# Patient Record
Sex: Female | Born: 1955 | Race: Black or African American | Hispanic: No | Smoking: Former smoker
Health system: Southern US, Community
[De-identification: ages and names within clinical notes are randomized; demographics above are authoritative.]

## PROBLEM LIST (undated history)

## (undated) DIAGNOSIS — K219 Gastro-esophageal reflux disease without esophagitis: Secondary | ICD-10-CM

## (undated) DIAGNOSIS — M810 Age-related osteoporosis without current pathological fracture: Secondary | ICD-10-CM

## (undated) DIAGNOSIS — R000 Tachycardia, unspecified: Secondary | ICD-10-CM

## (undated) DIAGNOSIS — N959 Unspecified menopausal and perimenopausal disorder: Secondary | ICD-10-CM

## (undated) DIAGNOSIS — N200 Calculus of kidney: Secondary | ICD-10-CM

## (undated) DIAGNOSIS — F32A Depression, unspecified: Secondary | ICD-10-CM

## (undated) DIAGNOSIS — F329 Major depressive disorder, single episode, unspecified: Secondary | ICD-10-CM

## (undated) DIAGNOSIS — B20 Human immunodeficiency virus [HIV] disease: Secondary | ICD-10-CM

## (undated) HISTORY — DX: Tachycardia, unspecified: R00.0

## (undated) HISTORY — DX: Gastro-esophageal reflux disease without esophagitis: K21.9

## (undated) HISTORY — PX: OTHER SURGICAL HISTORY: SHX169

## (undated) HISTORY — DX: Unspecified menopausal and perimenopausal disorder: N95.9

## (undated) HISTORY — DX: Human immunodeficiency virus (HIV) disease: B20

## (undated) HISTORY — DX: Depression, unspecified: F32.A

## (undated) HISTORY — DX: Major depressive disorder, single episode, unspecified: F32.9

## (undated) HISTORY — DX: Age-related osteoporosis without current pathological fracture: M81.0

---

## 1987-05-17 DIAGNOSIS — B20 Human immunodeficiency virus [HIV] disease: Secondary | ICD-10-CM

## 1987-05-17 HISTORY — DX: Human immunodeficiency virus (HIV) disease: B20

## 2009-04-08 ENCOUNTER — Emergency Department (HOSPITAL_COMMUNITY): Admission: EM | Admit: 2009-04-08 | Discharge: 2009-04-08 | Payer: Self-pay | Admitting: Emergency Medicine

## 2010-08-18 LAB — URINE MICROSCOPIC-ADD ON

## 2010-08-18 LAB — URINALYSIS, ROUTINE W REFLEX MICROSCOPIC
Bilirubin Urine: NEGATIVE
Glucose, UA: NEGATIVE mg/dL
Specific Gravity, Urine: 1.013 (ref 1.005–1.030)
Urobilinogen, UA: 1 mg/dL (ref 0.0–1.0)

## 2010-08-18 LAB — RAPID STREP SCREEN (MED CTR MEBANE ONLY): Streptococcus, Group A Screen (Direct): NEGATIVE

## 2017-02-11 ENCOUNTER — Emergency Department (HOSPITAL_COMMUNITY): Payer: Medicaid - Out of State

## 2017-02-11 ENCOUNTER — Emergency Department (HOSPITAL_COMMUNITY)
Admission: EM | Admit: 2017-02-11 | Discharge: 2017-02-11 | Disposition: A | Discharge status: Home / Self Care | Payer: Medicaid - Out of State | Attending: Emergency Medicine | Admitting: Emergency Medicine

## 2017-02-11 ENCOUNTER — Encounter (HOSPITAL_COMMUNITY): Payer: Self-pay | Admitting: Emergency Medicine

## 2017-02-11 DIAGNOSIS — N23 Unspecified renal colic: Secondary | ICD-10-CM | POA: Diagnosis not present

## 2017-02-11 DIAGNOSIS — Z79899 Other long term (current) drug therapy: Secondary | ICD-10-CM | POA: Insufficient documentation

## 2017-02-11 DIAGNOSIS — R1031 Right lower quadrant pain: Secondary | ICD-10-CM | POA: Diagnosis present

## 2017-02-11 HISTORY — DX: Calculus of kidney: N20.0

## 2017-02-11 LAB — URINALYSIS, ROUTINE W REFLEX MICROSCOPIC
BILIRUBIN URINE: NEGATIVE
Glucose, UA: NEGATIVE mg/dL
Ketones, ur: NEGATIVE mg/dL
LEUKOCYTES UA: NEGATIVE
NITRITE: NEGATIVE
PROTEIN: 100 mg/dL — AB
Specific Gravity, Urine: 1.02 (ref 1.005–1.030)
pH: 6 (ref 5.0–8.0)

## 2017-02-11 LAB — CBC
HCT: 34.4 % — ABNORMAL LOW (ref 36.0–46.0)
Hemoglobin: 12.2 g/dL (ref 12.0–15.0)
MCH: 31.9 pg (ref 26.0–34.0)
MCHC: 35.5 g/dL (ref 30.0–36.0)
MCV: 89.8 fL (ref 78.0–100.0)
PLATELETS: 223 10*3/uL (ref 150–400)
RBC: 3.83 MIL/uL — ABNORMAL LOW (ref 3.87–5.11)
RDW: 13.8 % (ref 11.5–15.5)
WBC: 6.9 10*3/uL (ref 4.0–10.5)

## 2017-02-11 LAB — COMPREHENSIVE METABOLIC PANEL
ALT: 18 U/L (ref 14–54)
AST: 30 U/L (ref 15–41)
Albumin: 3.9 g/dL (ref 3.5–5.0)
Alkaline Phosphatase: 48 U/L (ref 38–126)
Anion gap: 8 (ref 5–15)
BILIRUBIN TOTAL: 0.8 mg/dL (ref 0.3–1.2)
BUN: 15 mg/dL (ref 6–20)
CHLORIDE: 105 mmol/L (ref 101–111)
CO2: 27 mmol/L (ref 22–32)
CREATININE: 1 mg/dL (ref 0.44–1.00)
Calcium: 8.5 mg/dL — ABNORMAL LOW (ref 8.9–10.3)
GFR calc Af Amer: >60 mL/min (ref >60)
GFR, EST NON AFRICAN AMERICAN: 60 mL/min — AB (ref >60)
Glucose, Bld: 112 mg/dL — ABNORMAL HIGH (ref 65–99)
POTASSIUM: 3 mmol/L — AB (ref 3.5–5.1)
Sodium: 140 mmol/L (ref 135–145)
TOTAL PROTEIN: 7.4 g/dL (ref 6.5–8.1)

## 2017-02-11 LAB — LIPASE, BLOOD: LIPASE: 18 U/L (ref 11–51)

## 2017-02-11 MED ORDER — OXYCODONE-ACETAMINOPHEN 5-325 MG PO TABS
2.0000 | ORAL_TABLET | Freq: Once | ORAL | Status: DC
  Filled 2017-02-11: qty 2

## 2017-02-11 MED ORDER — PROMETHAZINE HCL 25 MG PO TABS
25.0000 mg | ORAL_TABLET | Freq: Once | ORAL | Status: AC
  Administered 2017-02-11: 25 mg via ORAL
  Filled 2017-02-11: qty 1

## 2017-02-11 MED ORDER — ONDANSETRON HCL 4 MG/2ML IJ SOLN
4.0000 mg | Freq: Once | INTRAMUSCULAR | Status: DC
Start: 2017-02-11 — End: 2017-02-11
  Filled 2017-02-11: qty 2

## 2017-02-11 MED ORDER — ONDANSETRON 4 MG PO TBDP
4.0000 mg | ORAL_TABLET | Freq: Once | ORAL | Status: AC
  Administered 2017-02-11: 4 mg via ORAL
  Filled 2017-02-11: qty 1

## 2017-02-11 MED ORDER — HYDROMORPHONE HCL 1 MG/ML IJ SOLN
1.0000 mg | Freq: Once | INTRAMUSCULAR | Status: AC
  Administered 2017-02-11: 1 mg via INTRAMUSCULAR
  Filled 2017-02-11: qty 1

## 2017-02-11 MED ORDER — ONDANSETRON 4 MG PO TBDP
4.0000 mg | ORAL_TABLET | Freq: Once | ORAL | Status: AC | PRN
  Administered 2017-02-11: 4 mg via ORAL
  Filled 2017-02-11: qty 1

## 2017-02-11 MED ORDER — HYDROCODONE-ACETAMINOPHEN 5-325 MG PO TABS: 1.0000 | ORAL_TABLET | Freq: Four times a day (QID) | ORAL | 0 refills | Status: DC | PRN

## 2017-02-11 MED ORDER — ONDANSETRON HCL 4 MG PO TABS: 4.0000 mg | ORAL_TABLET | Freq: Four times a day (QID) | ORAL | 0 refills | Status: DC | PRN

## 2017-02-11 MED ORDER — IBUPROFEN 600 MG PO TABS: 600.0000 mg | ORAL_TABLET | Freq: Four times a day (QID) | ORAL | 0 refills | Status: DC | PRN

## 2017-02-11 MED ORDER — MORPHINE SULFATE (PF) 4 MG/ML IV SOLN
4.0000 mg | Freq: Once | INTRAVENOUS | Status: AC
  Administered 2017-02-11: 4 mg via INTRAMUSCULAR
  Filled 2017-02-11: qty 1

## 2017-02-11 MED ORDER — KETOROLAC TROMETHAMINE 60 MG/2ML IM SOLN
60.0000 mg | Freq: Once | INTRAMUSCULAR | Status: AC
  Administered 2017-02-11: 60 mg via INTRAMUSCULAR
  Filled 2017-02-11: qty 2

## 2017-02-11 MED ORDER — HYDROMORPHONE HCL 1 MG/ML IJ SOLN
1.0000 mg | Freq: Once | INTRAMUSCULAR | Status: AC
  Administered 2017-02-11: 1 mg via INTRAVENOUS
  Filled 2017-02-11: qty 1

## 2017-02-11 NOTE — ED Triage Notes (Signed)
Pt states she has a hx of kidney stones and is having the same type symptoms as she does when she has them  Pt is c/o sharp pain in her right flank area and is having active nausea and vomiting in triage

## 2017-02-11 NOTE — ED Notes (Signed)
IV team successful with IV stick unsuccessful with lab draw. Phlebotomy has attempted also. Will contact main lab.

## 2017-02-11 NOTE — ED Notes (Signed)
She feels "better" and elects to be d'cd. At this time.

## 2017-02-11 NOTE — ED Provider Notes (Signed)
[facility]-EMERGENCY DEPT Provider Note   CSN: [id number] Arrival date & time: 02/11/17  0047     History   Chief Complaint Chief Complaint  Patient presents with  . Flank Pain    HPI Debra Freeman is a 61 y.o. female.  HPI Patient presents with right-sided flank pain that started yesterday morning. No radiation of the pain. Associated with nausea and vomiting. Patient says she's had chills but no fever. States she's had previous kidney stones in the past and this is similar. No diarrhea or constipation. Denies hematuria, frequency or urgency. No vaginal symptoms. Past Medical History:  Diagnosis Date  . Kidney stones     There are no active problems to display for this patient.   Past Surgical History:  Procedure Laterality Date  . c section x 4    . kidney stones removed      OB History    No data available       Home Medications    Prior to Admission medications   Medication Sig Start Date End Date Taking? Authorizing Provider  omeprazole (PRILOSEC) 40 MG capsule Take 40 mg by mouth daily.   Yes [provider]  QUEtiapine (SEROQUEL) 25 MG tablet Take 25 mg by mouth at bedtime.   Yes [provider]  HYDROcodone-acetaminophen (NORCO) 5-325 MG tablet Take 1-2 tablets by mouth every 6 (six) hours as needed for severe pain. 02/11/17   Loren Racer, MD  ibuprofen (ADVIL,MOTRIN) 600 MG tablet Take 1 tablet (600 mg total) by mouth every 6 (six) hours as needed for moderate pain. 02/11/17   Loren Racer, MD  ondansetron (ZOFRAN) 4 MG tablet Take 1 tablet (4 mg total) by mouth every 6 (six) hours as needed for nausea or vomiting. 02/11/17   Loren Racer, MD    Family History History reviewed. No pertinent family history.  Social History Social History  Substance Use Topics  . Smoking status: Never Smoker  . Smokeless tobacco: Never Used  . Alcohol use Yes     Comment: special occassions     Allergies   Mefoxin  [cefoxitin]   Review of Systems Review of Systems  Constitutional: Positive for chills. Negative for fever.  Respiratory: Negative for cough and shortness of breath.   Cardiovascular: Negative for chest pain.  Gastrointestinal: Positive for nausea and vomiting. Negative for abdominal pain, constipation and diarrhea.  Genitourinary: Positive for flank pain. Negative for difficulty urinating, dysuria, frequency, hematuria, pelvic pain, vaginal bleeding and vaginal discharge.  Musculoskeletal: Positive for back pain. Negative for myalgias, neck pain and neck stiffness.  Skin: Negative for rash and wound.  Neurological: Negative for dizziness, weakness, light-headedness, numbness and headaches.  All other systems reviewed and are negative.    Physical Exam Updated Vital Signs BP 130/77 (BP Location: Left Arm)   Pulse 77   Temp 98 F (36.7 C) (Oral)   Resp 16   Ht  (1.6 m)   Wt 63.5 kg (140 lb)   SpO2 100%   BMI 24.80 kg/m   Physical Exam  Constitutional: She is oriented to person, place, and time. She appears well-developed and well-nourished. No distress.  HENT:  Head: Normocephalic and atraumatic.  Mouth/Throat: Oropharynx is clear and moist. No oropharyngeal exudate.  Eyes: Pupils are equal, round, and reactive to light. EOM are normal.  Neck: Normal range of motion. Neck supple.  Cardiovascular: Normal rate and regular rhythm.  Exam reveals no gallop and no friction rub.   No murmur heard. Pulmonary/Chest:  Effort normal and breath sounds normal. No respiratory distress. She has no wheezes. She has no rales.  Abdominal: Soft. Bowel sounds are normal. She exhibits no distension and no mass. There is no tenderness. There is no rebound and no guarding. No hernia.  Musculoskeletal: Normal range of motion. She exhibits no edema or tenderness.  No midline thoracic or lumbar tenderness. No CVA tenderness.  Neurological: She is alert and oriented to person, place, and time.   Moving all extremities without focal deficit. Sensation intact.  Skin: Skin is warm and dry. No rash noted. No erythema.  Psychiatric: She has a normal mood and affect. Her behavior is normal.  Nursing note and vitals reviewed.    ED Treatments / Results  Labs (all labs ordered are listed, but only abnormal results are displayed) Labs Reviewed  URINALYSIS, ROUTINE W REFLEX MICROSCOPIC - Abnormal; Notable for the following:       Result Value   APPearance HAZY (*)    Hgb urine dipstick LARGE (*)    Protein, ur 100 (*)    Bacteria, UA RARE (*)    Squamous Epithelial / LPF 0-5 (*)    All other components within normal limits  COMPREHENSIVE METABOLIC PANEL - Abnormal; Notable for the following:    Potassium 3.0 (*)    Glucose, Bld 112 (*)    Calcium 8.5 (*)    GFR calc non Af Amer 60 (*)    All other components within normal limits  CBC - Abnormal; Notable for the following:    RBC 3.83 (*)    HCT 34.4 (*)    All other components within normal limits  LIPASE, BLOOD    EKG  EKG Interpretation None       Radiology Ct Abdomen Pelvis Wo Contrast  Result Date: 02/11/2017 CLINICAL DATA:  Right flank pain. EXAM: CT ABDOMEN AND PELVIS WITHOUT CONTRAST TECHNIQUE: Multidetector CT imaging of the abdomen and pelvis was performed following the standard protocol without IV contrast. COMPARISON:  None. FINDINGS: Lower chest: Bilateral lower lobe bronchiectasis. Linear atelectasis or scarring in the lung basis. No pleural fluid. Hepatobiliary: No focal hepatic lesion allowing for lack contrast. Gallbladder physiologically distended, no calcified stone. No biliary dilatation. Pancreas: Proximal pancreatic ductal dilatation measuring 7 mm. No peripancreatic inflammation. No evidence of focal mass on noncontrast exam. Spleen: Normal in size without focal abnormality. Adrenals/Urinary Tract: Normal adrenal glands. Obstructing 5 mm stone in the mid distal right ureter with moderate to severe  hydroureteronephrosis. Mild right perinephric edema. Exophytic cyst from the mid right kidney measures 13 mm. Possible additional cysts in the upper right kidney, incompletely characterized without contrast. Three nonobstructing stones in the left kidney. Multiple left renal cysts, largest in the upper pole measuring 5.1 cm. No left hydronephrosis. Left ureter is decompressed. Urinary bladder is nondistended without stone. Stomach/Bowel: Small hiatal hernia. Stomach physiologically distended. No small bowel inflammation, wall thickening or obstruction. Prominent proximal appendix likely spurious. No periappendiceal inflammation. Moderate colonic stool burden with tortuosity, no colonic wall thickening. Vascular/Lymphatic: Aortic atherosclerosis. No abdominal or pelvic adenopathy allowing for noncontrast exam. Reproductive: Uterus and bilateral adnexa are unremarkable. Other: No free air, free fluid, or intra-abdominal fluid collection. Musculoskeletal: Mild scoliosis and degenerative change in the spine. There are no acute or suspicious osseous abnormalities. IMPRESSION: 1. Obstructing 5 mm stone in the mid distal right ureter with moderate to severe hydronephrosis. 2. Nonobstructing stones in the left kidney. 3. Pancreatic ductal dilatation without peripancreatic inflammatory change. Acuity is uncertain, no  prior exams available for comparison. This can be seen in the setting of chronic pancreatitis but is nonspecific. Recommend correlation with any prior imaging. If ductal dilatation is new, consider elective pancreatic protocol MRI to exclude central pancreatic lesion. 4.  Aortic Atherosclerosis (ICD10-I70.0). 5. Bilateral lobe bronchiectasis in the lung bases, streaky atelectasis or scarring. Electronically Signed   By: Rubye Oaks M.D.   On: 02/11/2017 03:45    Procedures Procedures (including critical care time)  Medications Ordered in ED Medications  ondansetron (ZOFRAN-ODT) disintegrating tablet  4 mg (4 mg Oral Given 02/11/17 0216)  ketorolac (TORADOL) injection 60 mg (60 mg Intramuscular Given 02/11/17 0239)  ondansetron (ZOFRAN-ODT) disintegrating tablet 4 mg (4 mg Oral Given 02/11/17 0303)  morphine 4 MG/ML injection 4 mg (4 mg Intramuscular Given 02/11/17 0241)  HYDROmorphone (DILAUDID) injection 1 mg (1 mg Intravenous Given 02/11/17 0355)  promethazine (PHENERGAN) tablet 25 mg (25 mg Oral Given 02/11/17 0557)  HYDROmorphone (DILAUDID) injection 1 mg (1 mg Intramuscular Given 02/11/17 0753)  ondansetron (ZOFRAN-ODT) disintegrating tablet 4 mg (4 mg Oral Given 02/11/17 0753)    Initial Impression / Assessment and Plan / ED Course  I have reviewed the triage vital signs and the nursing notes.  Pertinent labs & imaging results that were available during my care of the patient were reviewed by me and considered in my medical decision making (see chart for details).     Patient is feeling better after medication. Vital signs remained stable. Discussed with Dr. Berneice Heinrich. He does not believe UA with isolated white blood cells need antibiotic coverage. Advised to follow-up as outpatient.  Final Clinical Impressions(s) / ED Diagnoses   Final diagnoses:  Ureteral colic    New Prescriptions Discharge Medication List as of 02/11/2017  7:11 AM    START taking these medications   Details  HYDROcodone-acetaminophen (NORCO) 5-325 MG tablet Take 1-2 tablets by mouth every 6 (six) hours as needed for severe pain., Starting Sat 02/11/2017, Print    ibuprofen (ADVIL,MOTRIN) 600 MG tablet Take 1 tablet (600 mg total) by mouth every 6 (six) hours as needed for moderate pain., Starting Sat 02/11/2017, Print    ondansetron (ZOFRAN) 4 MG tablet Take 1 tablet (4 mg total) by mouth every 6 (six) hours as needed for nausea or vomiting., Starting Sat 02/11/2017, Print         Loren Racer, MD 02/12/17 [phone]

## 2017-02-11 NOTE — ED Notes (Signed)
Dr. Ranae Palms is aware that patient is requesting Dilaudid via IV. At this time she has been transported to CT and per provider will await results prior to additional medications.

## 2017-02-11 NOTE — ED Notes (Signed)
Per main lab there will be a delay in drawing labs.

## 2017-02-11 NOTE — ED Notes (Signed)
Patient transported to CT 

## 2017-02-11 NOTE — ED Notes (Signed)
Writer attempted blood work, unsuccessful, RN notified.  

## 2017-02-11 NOTE — ED Notes (Signed)
She requests to wait here until 1000 hours, to see "if I feel better". I agree to this plan and will re-evaluate then.

## 2017-02-11 NOTE — ED Notes (Signed)
In spite of her having received an antiemetic, she has just vomited a sm. Amt.

## 2017-10-22 DIAGNOSIS — B20 Human immunodeficiency virus [HIV] disease: Secondary | ICD-10-CM | POA: Insufficient documentation

## 2017-11-07 ENCOUNTER — Other Ambulatory Visit: Payer: Medicaid Other

## 2017-11-07 ENCOUNTER — Other Ambulatory Visit: Payer: Self-pay | Admitting: *Deleted

## 2017-11-07 ENCOUNTER — Ambulatory Visit: Payer: Medicaid Other

## 2017-11-07 ENCOUNTER — Other Ambulatory Visit (HOSPITAL_COMMUNITY)
Admission: RE | Admit: 2017-11-07 | Discharge: 2017-11-07 | Disposition: A | Discharge status: Home / Self Care | Payer: Medicaid Other | Source: Ambulatory Visit | Attending: Internal Medicine | Admitting: Internal Medicine

## 2017-11-07 DIAGNOSIS — Z87898 Personal history of other specified conditions: Secondary | ICD-10-CM | POA: Insufficient documentation

## 2017-11-07 DIAGNOSIS — B20 Human immunodeficiency virus [HIV] disease: Secondary | ICD-10-CM

## 2017-11-07 DIAGNOSIS — Z113 Encounter for screening for infections with a predominantly sexual mode of transmission: Secondary | ICD-10-CM

## 2017-11-07 DIAGNOSIS — Z79899 Other long term (current) drug therapy: Secondary | ICD-10-CM

## 2017-11-07 LAB — COMPLETE METABOLIC PANEL WITH GFR
AG RATIO: 1.2 (calc) (ref 1.0–2.5)
ALT: 9 U/L (ref 6–29)
AST: 19 U/L (ref 10–35)
Albumin: 4.6 g/dL (ref 3.6–5.1)
Alkaline phosphatase (APISO): 57 U/L (ref 33–130)
BUN/Creatinine Ratio: 16 (calc) (ref 6–22)
BUN: 16 mg/dL (ref 7–25)
CHLORIDE: 105 mmol/L (ref 98–110)
CO2: 26 mmol/L (ref 20–32)
Calcium: 9.6 mg/dL (ref 8.6–10.4)
Creat: 1.02 mg/dL — ABNORMAL HIGH (ref 0.50–0.99)
GFR, EST AFRICAN AMERICAN: 69 mL/min/{1.73_m2} (ref >=60)
GFR, Est Non African American: 59 mL/min/{1.73_m2} — ABNORMAL LOW (ref >=60)
GLUCOSE: 80 mg/dL (ref 65–99)
Globulin: 3.7 g/dL (calc) (ref 1.9–3.7)
POTASSIUM: 4 mmol/L (ref 3.5–5.3)
Sodium: 139 mmol/L (ref 135–146)
TOTAL PROTEIN: 8.3 g/dL — AB (ref 6.1–8.1)
Total Bilirubin: 0.4 mg/dL (ref 0.2–1.2)

## 2017-11-08 ENCOUNTER — Telehealth: Payer: Self-pay | Admitting: Behavioral Health

## 2017-11-08 ENCOUNTER — Other Ambulatory Visit: Payer: Self-pay | Admitting: Behavioral Health

## 2017-11-08 DIAGNOSIS — K219 Gastro-esophageal reflux disease without esophagitis: Secondary | ICD-10-CM

## 2017-11-08 LAB — T-HELPER CELL (CD4) - (RCID CLINIC ONLY)
CD4 % Helper T Cell: 25 % — ABNORMAL LOW (ref 33–55)
CD4 T CELL ABS: 610 /uL (ref 400–2700)

## 2017-11-08 LAB — URINALYSIS
Bilirubin Urine: NEGATIVE
GLUCOSE, UA: NEGATIVE
Hgb urine dipstick: NEGATIVE
Ketones, ur: NEGATIVE
Leukocytes, UA: NEGATIVE
Nitrite: NEGATIVE
PH: 8 (ref 5.0–8.0)
Protein, ur: NEGATIVE
SPECIFIC GRAVITY, URINE: 1.01 (ref 1.001–1.03)

## 2017-11-08 LAB — URINE CYTOLOGY ANCILLARY ONLY
Chlamydia: NEGATIVE
Neisseria Gonorrhea: NEGATIVE

## 2017-11-08 MED ORDER — OMEPRAZOLE 40 MG PO CPDR: 40.0000 mg | DELAYED_RELEASE_CAPSULE | Freq: Every day | ORAL | 0 refills | Status: DC

## 2017-11-08 MED ORDER — ONDANSETRON HCL 4 MG PO TABS
4.0000 mg | ORAL_TABLET | Freq: Four times a day (QID) | ORAL | 0 refills | Status: DC | PRN
Start: 2017-11-08 — End: 2017-11-09

## 2017-11-08 NOTE — Telephone Encounter (Signed)
Called patient, verified that she uses [facility] at [address].  Medication's sent to the pharmacy.  Patient verbalized understanding. Angeline SlimAshley Mella Inclan RN

## 2017-11-08 NOTE — Telephone Encounter (Signed)
[place]  Patient states she has severe GERD and needs a prescription for Omeprazole and Zofran.  Patient does not have an appointment with Dr. Drue SecondSnider until 11/21/2017 and has not set up Primary Care yet.  Angeline SlimAshley Hill RN

## 2017-11-09 ENCOUNTER — Other Ambulatory Visit: Payer: Self-pay | Admitting: Behavioral Health

## 2017-11-09 DIAGNOSIS — K219 Gastro-esophageal reflux disease without esophagitis: Secondary | ICD-10-CM

## 2017-11-09 MED ORDER — ONDANSETRON HCL 4 MG PO TABS
4.0000 mg | ORAL_TABLET | Freq: Four times a day (QID) | ORAL | 0 refills | Status: DC | PRN
Start: 2017-11-09 — End: 2017-11-24

## 2017-11-13 LAB — HIV-1 RNA ULTRAQUANT REFLEX TO GENTYP+
HIV 1 RNA Quant: <20 copies/mL
HIV-1 RNA Quant, Log: <1.3 Log copies/mL

## 2017-11-21 ENCOUNTER — Other Ambulatory Visit: Payer: Self-pay

## 2017-11-21 ENCOUNTER — Encounter: Payer: Self-pay | Admitting: Internal Medicine

## 2017-11-21 ENCOUNTER — Ambulatory Visit (INDEPENDENT_AMBULATORY_CARE_PROVIDER_SITE_OTHER): Payer: Medicaid Other | Admitting: Internal Medicine

## 2017-11-21 ENCOUNTER — Ambulatory Visit: Payer: Medicaid Other | Admitting: Pharmacist Clinician (PhC)/ Clinical Pharmacy Specialist

## 2017-11-21 VITALS — BP 143/83 | HR 76 | Ht 63.0 in | Wt 138.0 lb

## 2017-11-21 DIAGNOSIS — Z8619 Personal history of other infectious and parasitic diseases: Secondary | ICD-10-CM | POA: Insufficient documentation

## 2017-11-21 DIAGNOSIS — R000 Tachycardia, unspecified: Secondary | ICD-10-CM | POA: Insufficient documentation

## 2017-11-21 DIAGNOSIS — M810 Age-related osteoporosis without current pathological fracture: Secondary | ICD-10-CM | POA: Insufficient documentation

## 2017-11-21 DIAGNOSIS — B182 Chronic viral hepatitis C: Secondary | ICD-10-CM | POA: Diagnosis not present

## 2017-11-21 DIAGNOSIS — B977 Papillomavirus as the cause of diseases classified elsewhere: Secondary | ICD-10-CM | POA: Insufficient documentation

## 2017-11-21 DIAGNOSIS — K219 Gastro-esophageal reflux disease without esophagitis: Secondary | ICD-10-CM | POA: Insufficient documentation

## 2017-11-21 DIAGNOSIS — Z79899 Other long term (current) drug therapy: Secondary | ICD-10-CM

## 2017-11-21 DIAGNOSIS — R85619 Unspecified abnormal cytological findings in specimens from anus: Secondary | ICD-10-CM | POA: Insufficient documentation

## 2017-11-21 DIAGNOSIS — B20 Human immunodeficiency virus [HIV] disease: Secondary | ICD-10-CM

## 2017-11-21 DIAGNOSIS — M150 Primary generalized (osteo)arthritis: Secondary | ICD-10-CM | POA: Diagnosis not present

## 2017-11-21 DIAGNOSIS — N200 Calculus of kidney: Secondary | ICD-10-CM | POA: Insufficient documentation

## 2017-11-21 DIAGNOSIS — N959 Unspecified menopausal and perimenopausal disorder: Secondary | ICD-10-CM | POA: Insufficient documentation

## 2017-11-21 DIAGNOSIS — M159 Polyosteoarthritis, unspecified: Secondary | ICD-10-CM

## 2017-11-21 MED ORDER — IBUPROFEN 800 MG PO TABS: 800.0000 mg | ORAL_TABLET | Freq: Three times a day (TID) | ORAL | 1 refills | Status: DC | PRN

## 2017-11-21 MED ORDER — BICTEGRAVIR-EMTRICITAB-TENOFOV 50-200-25 MG PO TABS: 1.0000 | ORAL_TABLET | Freq: Every day | ORAL | 11 refills | Status: DC

## 2017-11-21 MED ORDER — QUETIAPINE FUMARATE 25 MG PO TABS: 25.0000 mg | ORAL_TABLET | Freq: Every day | ORAL | 3 refills | Status: DC

## 2017-11-21 MED ORDER — GABAPENTIN 300 MG PO CAPS: 300.0000 mg | ORAL_CAPSULE | Freq: Three times a day (TID) | ORAL | 3 refills | Status: DC

## 2017-11-21 NOTE — Progress Notes (Signed)
.     RFV: establishing care for hiv disease  Patient ID: Debra Freeman, female   DOB: 1956/05/15, 62 y.o.   MRN: [id number]  HPI 62yo F with hiv disease-Hep C chronic, dx in 1989, originally in [place], [place] remote hx of PIWDU, but has been receiving her care in [place] recently relocated to [place] to be closer to her sons. She is On descovy and isentress. Has hx of being treated for chronic hep c. Other part of her clinical history is that she is a difficult blood draw and has been getting blood draws via femoral access/sticks.  Art hx: looks like she was on epzicom plus ral  I have reviewed her records from previous doctor  Hx of vaccines tdap in 2013 Pneumovax 2011 Non immune to hep a?  Hx of hep b c ab positive Outpatient Encounter Medications as of 11/21/2017  Medication Sig  . omeprazole (PRILOSEC) 40 MG capsule Take 1 capsule (40 mg total) by mouth daily.  . ondansetron (ZOFRAN) 4 MG tablet Take 1 tablet (4 mg total) by mouth every 6 (six) hours as needed for nausea or vomiting.  Marland Kitchen QUEtiapine (SEROQUEL) 25 MG tablet Take 25 mg by mouth at bedtime.  Marland Kitchen ibuprofen (ADVIL,MOTRIN) 600 MG tablet Take 1 tablet (600 mg total) by mouth every 6 (six) hours as needed for moderate pain. (Patient not taking: Reported on 11/21/2017)  . [DISCONTINUED] HYDROcodone-acetaminophen (NORCO) 5-325 MG tablet Take 1-2 tablets by mouth every 6 (six) hours as needed for severe pain.   No facility-administered encounter medications on file as of 11/21/2017.      Patient Active Problem List   Diagnosis Date Noted  . History of difficult venous access 11/07/2017  . Human immunodeficiency virus (HIV) disease (HCC) 10/22/2017  - osteoporosis - chronic hepatitis c Hx of abn anal pap smear - hx of nephrolithiasis -menopause -hpv - hx depresison -gerd - hx of herpes - insomonai - tachycardia   Health Maintenance Due  Topic Date Due  . Hepatitis C Screening  07-Oct-1955  . TETANUS/TDAP   07/13/1975  . COLONOSCOPY  07/12/2006    Social History   Tobacco Use  . Smoking status: Never Smoker  . Smokeless tobacco: Never Used  Substance Use Topics  . Alcohol use: Yes    Comment: special occassions  . Drug use: No  family history includes Arthritis in her mother; Lung cancer in her father. Review of Systems Review of Systems  Constitutional: Negative for fever, chills, diaphoresis, activity change, appetite change, fatigue and unexpected weight change.  HENT: Negative for congestion, sore throat, rhinorrhea, sneezing, trouble swallowing and sinus pressure.  Eyes: Negative for photophobia and visual disturbance.  Respiratory: Negative for cough, chest tightness, shortness of breath, wheezing and stridor.  Cardiovascular: Negative for chest pain, palpitations and leg swelling.  Gastrointestinal: Negative for nausea, vomiting, abdominal pain, diarrhea, constipation, blood in stool, abdominal distention and anal bleeding.  Genitourinary: Negative for dysuria, hematuria, flank pain and difficulty urinating.  Musculoskeletal: Negative for myalgias, back pain, joint swelling, arthralgias and gait problem.  Skin: Negative for color change, pallor, rash and wound.  Neurological: Negative for dizziness, tremors, weakness and light-headedness.  Hematological: Negative for adenopathy. Does not bruise/bleed easily.  Psychiatric/Behavioral: Negative for behavioral problems, confusion, sleep disturbance, dysphoric mood, decreased concentration and agitation.    Physical Exam   BP (!) 143/83   Pulse 76   Ht 5\' 3"  (1.6 m)   Wt 138 lb (62.6 kg)  BMI 24.45 kg/m   Physical Exam  Constitutional:  oriented to person, place, and time. appears well-developed and well-nourished. No distress.  HENT: [place]/AT, PERRLA, no scleral icterus Mouth/Throat: Oropharynx is clear and moist. No oropharyngeal exudate.  Cardiovascular: Normal rate, regular rhythm and normal heart sounds. Exam reveals no  gallop and no friction rub.  No murmur heard.  Pulmonary/Chest: Effort normal and breath sounds normal. No respiratory distress.  has no wheezes.  Neck = supple, no nuchal rigidity Abdominal: Soft. Bowel sounds are normal.  exhibits no distension. There is no tenderness.  Lymphadenopathy: no cervical adenopathy. No axillary adenopathy Neurological: alert and oriented to person, place, and time.  Skin: Skin is warm and dry. No rash noted. No erythema.  Psychiatric: a normal mood and affect.  behavior is normal.   Lab Results  Component Value Date   CD4TCELL 25 (L) 11/07/2017   Lab Results  Component Value Date   CD4TABS 610 11/07/2017   Lab Results  Component Value Date   HIV1RNAQUANT <20 NOT DETECTED 11/07/2017   No results found for: HEPBSAB No results found for: RPR, LABRPR  CBC Lab Results  Component Value Date   WBC 6.9 02/11/2017   RBC 3.83 (L) 02/11/2017   HGB 12.2 02/11/2017   HCT 34.4 (L) 02/11/2017   PLT 223 02/11/2017   MCV 89.8 02/11/2017   MCH 31.9 02/11/2017   MCHC 35.5 02/11/2017   RDW 13.8 02/11/2017    BMET Lab Results  Component Value Date   NA 139 11/07/2017   K 4.0 11/07/2017   CL 105 11/07/2017   CO2 26 11/07/2017   GLUCOSE 80 11/07/2017   BUN 16 11/07/2017   CREATININE 1.02 (H) 11/07/2017   CALCIUM 9.6 11/07/2017   GFRNONAA 59 (L) 11/07/2017   GFRAA 69 11/07/2017   Remote - [id number] negative I QTF negative   Assessment and Plan  hiv disease =Will change her to biktarvy. We will just check her for VL in 4-6 wk. She is ok with doing vein from foot at next visit. As long as limited  Blood draw  Peripheral neuropathy= Give a trial of gabapentin for peripheral neuropathy  oa pain =Refill on ibuprofen 800mg  prn - has hx of osteoporosis to hip but not sure if has hx of AVN or needed mri. For now use ibuprofen sparinging  Chronic hepatitis c without hepatic coma = will do ruq ultrasound liver  Difficult venous access = will need to  send orders to hospital? For futher lab draws. No more than q 6months.  Hx of herpes = has prn valtrex

## 2017-11-21 NOTE — Progress Notes (Signed)
HPI: Debra Freeman is a [age] y.o. female who is here for her tx of care.   Allergies: Allergies  Allergen Reactions  . Mefoxin [Cefoxitin]     Vitals: BP: 143/83 (07/09 1356) Pulse Rate: 76 (07/09 1356)  Past Medical History: Past Medical History:  Diagnosis Date  . Depression   . GERD (gastroesophageal reflux disease)   . HIV disease (HCC) [place]  . Kidney stones   . Menopausal disorder   . Osteoporosis   . Tachycardia     Social History: Social History   Socioeconomic History  . Marital status: Legally Separated    Spouse name: Not on file  . Number of children: Not on file  . Years of education: Not on file  . Highest education level: Not on file  Occupational History  . Not on file  Social Needs  . Financial resource strain: Not on file  . Food insecurity:    Worry: Not on file    Inability: Not on file  . Transportation needs:    Medical: Not on file    Non-medical: Not on file  Tobacco Use  . Smoking status: Never Smoker  . Smokeless tobacco: Never Used  Substance and Sexual Activity  . Alcohol use: Yes    Comment: special occassions  . Drug use: No  . Sexual activity: Not on file  Lifestyle  . Physical activity:    Days per week: Not on file    Minutes per session: Not on file  . Stress: Not on file  Relationships  . Social connections:    Talks on phone: Not on file    Gets together: Not on file    Attends religious service: Not on file    Active member of club or organization: Not on file    Attends meetings of clubs or organizations: Not on file    Relationship status: Not on file  Other Topics Concern  . Not on file  Social History Narrative  . Not on file    Previous Regimen:   Current Regimen: Isentress/Descovy  Labs: HIV 1 RNA Quant (copies/mL)  Date Value  11/07/2017 <20 NOT DETECTED   CD4 T Cell Abs (/uL)  Date Value  11/07/2017 610    CrCl: Estimated Creatinine Clearance: 47.9 mL/min (A) (by C-G  formula based on SCr of 1.02 mg/dL (H)).  Lipids: No results found for: CHOL, TRIG, HDL, CHOLHDL, VLDL, LDLCALC  Assessment: Debra Freeman is here today to establish care with us for her HIV. She is suppressed on her current regimen of Isentress and Descovy. She is very religious with taking her meds. We are going to switch her to [place] today. This would make it much easier for her pill burden. Counseled on side effects and interactions.   Recommendations: Stop Isentress/Descovy Start [place] 1 daily  Ulyses SouthwardMinh Hesper Venturella, PharmD, BCPS, AAHIVP, CPP Clinical Infectious Disease Pharmacist [facility] 11/21/2017, 2:57 PM

## 2017-11-24 ENCOUNTER — Other Ambulatory Visit: Payer: Self-pay | Admitting: Internal Medicine

## 2017-11-24 DIAGNOSIS — K219 Gastro-esophageal reflux disease without esophagitis: Secondary | ICD-10-CM

## 2017-11-27 ENCOUNTER — Telehealth: Payer: Self-pay | Admitting: *Deleted

## 2017-11-27 ENCOUNTER — Other Ambulatory Visit: Payer: Self-pay | Admitting: Internal Medicine

## 2017-11-27 DIAGNOSIS — K219 Gastro-esophageal reflux disease without esophagitis: Secondary | ICD-10-CM

## 2017-11-27 NOTE — Telephone Encounter (Signed)
Patient called and advised she needs a PA for Seroquel advised will work on it and give her a call once it is complete.  Called Cover My Meds and got the medication approved and faxed information to the pharmacy.  Patient aware and will call pharmacy later today.

## 2017-11-28 ENCOUNTER — Other Ambulatory Visit: Payer: Self-pay

## 2017-11-28 DIAGNOSIS — K219 Gastro-esophageal reflux disease without esophagitis: Secondary | ICD-10-CM

## 2017-11-28 MED ORDER — OMEPRAZOLE 40 MG PO CPDR: 40.0000 mg | DELAYED_RELEASE_CAPSULE | Freq: Every day | ORAL | 0 refills | Status: DC

## 2017-12-01 ENCOUNTER — Other Ambulatory Visit: Payer: Self-pay | Admitting: *Deleted

## 2017-12-01 ENCOUNTER — Other Ambulatory Visit: Payer: Self-pay | Admitting: Internal Medicine

## 2017-12-01 DIAGNOSIS — M810 Age-related osteoporosis without current pathological fracture: Secondary | ICD-10-CM

## 2017-12-01 DIAGNOSIS — B20 Human immunodeficiency virus [HIV] disease: Secondary | ICD-10-CM

## 2017-12-01 DIAGNOSIS — K219 Gastro-esophageal reflux disease without esophagitis: Secondary | ICD-10-CM

## 2017-12-01 MED ORDER — ONDANSETRON HCL 4 MG PO TABS: 4.0000 mg | ORAL_TABLET | Freq: Four times a day (QID) | ORAL | 0 refills | Status: DC | PRN

## 2017-12-01 MED ORDER — PRENATAL MULTIVITAMIN CH: 1.0000 | ORAL_TABLET | Freq: Every day | ORAL | 0 refills | Status: DC

## 2017-12-01 NOTE — Progress Notes (Signed)
Refills called in per patietn reqeust. Andree CossHowell, Michelle M, RN

## 2017-12-13 ENCOUNTER — Other Ambulatory Visit: Payer: Self-pay | Admitting: Internal Medicine

## 2017-12-13 ENCOUNTER — Encounter: Payer: Self-pay | Admitting: Internal Medicine

## 2017-12-13 DIAGNOSIS — B20 Human immunodeficiency virus [HIV] disease: Secondary | ICD-10-CM

## 2017-12-13 DIAGNOSIS — K219 Gastro-esophageal reflux disease without esophagitis: Secondary | ICD-10-CM

## 2017-12-15 ENCOUNTER — Encounter: Payer: Self-pay | Admitting: *Deleted

## 2017-12-18 ENCOUNTER — Other Ambulatory Visit: Payer: Self-pay

## 2017-12-18 ENCOUNTER — Telehealth: Payer: Self-pay

## 2017-12-18 DIAGNOSIS — B20 Human immunodeficiency virus [HIV] disease: Secondary | ICD-10-CM

## 2017-12-18 DIAGNOSIS — K219 Gastro-esophageal reflux disease without esophagitis: Secondary | ICD-10-CM

## 2017-12-18 MED ORDER — ONDANSETRON HCL 4 MG PO TABS: ORAL_TABLET | ORAL | 1 refills | Status: DC

## 2017-12-18 NOTE — Telephone Encounter (Signed)
Pt called today stating pharmacy has been trying to get in reach with office regarding a new prescription for Zofran 4mg  tabs. PT stated Dr. Drue SecondSnider has prescribes the prescription before and would like this to be done again she can take her Biktarvy. Informed pt that a refill was sent on 12/14/17 to the pharmacy with one additional refill.  Called pt's pharmacy to speak with the pharmacist to see what was going on with pt's medication. Pharmacist stated that they did not see any refills sent on 12/14/17, but did have a refill for the medication in the system. Pharmacy stated after running insurance claim pt would have to spend $140 out of pocket for this medication. Pharmacist advised that a new prescription be sent in for pt. Will inform MD that pt's pharmacy does not cover Zofran and to see if a new prescription could be sent in.  Pt would like something sent in asap due to getting nauseous while taking medication. Debra Freeman, [place]

## 2017-12-19 ENCOUNTER — Telehealth: Payer: Self-pay

## 2017-12-19 MED ORDER — PROMETHAZINE HCL 25 MG PO TABS: 25.0000 mg | ORAL_TABLET | Freq: Three times a day (TID) | ORAL | 3 refills | Status: DC | PRN

## 2017-12-19 NOTE — Telephone Encounter (Signed)
Can try phenergan 25mg  po q 8hr prn #30 with 3 refills

## 2017-12-19 NOTE — Telephone Encounter (Signed)
Per verbal order will send in Phenergan 25 mg po q8hrs. PRN #30 with 3 refills.

## 2017-12-25 ENCOUNTER — Encounter: Payer: Self-pay | Admitting: Internal Medicine

## 2017-12-25 ENCOUNTER — Ambulatory Visit (INDEPENDENT_AMBULATORY_CARE_PROVIDER_SITE_OTHER): Payer: Medicaid Other | Admitting: Internal Medicine

## 2017-12-25 VITALS — BP 123/79 | HR 83 | Temp 98.4°F | Wt 130.4 lb

## 2017-12-25 DIAGNOSIS — B182 Chronic viral hepatitis C: Secondary | ICD-10-CM | POA: Diagnosis not present

## 2017-12-25 DIAGNOSIS — G930 Cerebral cysts: Secondary | ICD-10-CM

## 2017-12-25 DIAGNOSIS — M810 Age-related osteoporosis without current pathological fracture: Secondary | ICD-10-CM

## 2017-12-25 DIAGNOSIS — B20 Human immunodeficiency virus [HIV] disease: Secondary | ICD-10-CM | POA: Diagnosis present

## 2017-12-25 DIAGNOSIS — K219 Gastro-esophageal reflux disease without esophagitis: Secondary | ICD-10-CM | POA: Diagnosis not present

## 2017-12-25 MED ORDER — QUETIAPINE FUMARATE 25 MG PO TABS: 25.0000 mg | ORAL_TABLET | Freq: Every day | ORAL | 11 refills | Status: DC

## 2017-12-25 MED ORDER — OMEPRAZOLE 40 MG PO CPDR: DELAYED_RELEASE_CAPSULE | ORAL | 11 refills | Status: DC

## 2017-12-25 MED ORDER — PRENATAL MULTIVITAMIN CH: 1.0000 | ORAL_TABLET | Freq: Every day | ORAL | 3 refills | Status: DC

## 2017-12-25 MED ORDER — VALACYCLOVIR HCL 1 G PO TABS: 1000.0000 mg | ORAL_TABLET | Freq: Three times a day (TID) | ORAL | 3 refills | Status: DC

## 2017-12-25 MED ORDER — IBUPROFEN 800 MG PO TABS: 800.0000 mg | ORAL_TABLET | Freq: Three times a day (TID) | ORAL | 11 refills | Status: DC | PRN

## 2017-12-25 MED ORDER — ONDANSETRON HCL 4 MG PO TABS: ORAL_TABLET | ORAL | 11 refills | Status: DC

## 2017-12-25 NOTE — Progress Notes (Signed)
RFV: follow up for hiv disease  Patient ID: Debra Freeman, female   DOB: October 18, 1955, 62 y.o.   MRN: [id number]  HPI Debra ColasDedra is a 62yo F with long standing HIV disease [place] and now being followed at our clinic, we saw her for hte first time roughly 4 wks ago. She had been on a raltegravir based regimen and we  changed her to biktarvy at our last visit. She has been on it for 4 weeks. Initially having nausea with medication but improved with taking zofran. It was cost prohibitive on her insurance to do zofran thus changed her to phenergan -which she has yet to try. She has had tardive dyskinesia with reglan in the past.--she mentions  She has hx of headache and migraines she reports has occasional headache. Also has hx of arachnoid cyst - causing HA ?? She was to follow up with NSGY every year or at least imaging which she has not done in a few years.  She takes ibuprofen regularly for her OA but also helps her HA.  She notices outpouching at hte base of her neck where she has previous scarring  on the right side of neck Outpatient Encounter Medications as of 12/25/2017  Medication Sig  . bictegravir-emtricitabine-tenofovir AF (BIKTARVY) 50-200-25 MG TABS tablet Take 1 tablet by mouth daily.  Marland Kitchen. gabapentin (NEURONTIN) 300 MG capsule Take 1 capsule (300 mg total) by mouth 3 (three) times daily. Start with taking 1 tab daily for 7 days followed by 2x/day for 7 days, then  . ibuprofen (ADVIL,MOTRIN) 800 MG tablet Take 1 tablet (800 mg total) by mouth every 8 (eight) hours as needed.  Marland Kitchen. omeprazole (PRILOSEC) 40 MG capsule TAKE 1 CAPSULE BY MOUTH EVERY DAY  . ondansetron (ZOFRAN) 4 MG tablet TAKE 1 TABLET BY MOUTH EVERY 6 HOURS AS NEEDED FOR NAUSEA AND VOMITING  . Prenatal Vit-Fe Fumarate-FA (PRENATAL MULTIVITAMIN) TABS tablet Take 1 tablet by mouth daily at 12 noon. Separate from [place] by 4 hours.  . promethazine (PHENERGAN) 25 MG tablet Take 1 tablet (25 mg total)  by mouth every 8 (eight) hours as needed for nausea or vomiting.  Marland Kitchen. QUEtiapine (SEROQUEL) 25 MG tablet Take 1 tablet (25 mg total) by mouth at bedtime.  . valACYclovir (VALTREX) 1000 MG tablet Take 1,000 mg by mouth every 8 (eight) hours.  . [DISCONTINUED] ibuprofen (ADVIL,MOTRIN) 600 MG tablet Take 1 tablet (600 mg total) by mouth every 6 (six) hours as needed for moderate pain. (Patient not taking: Reported on 11/21/2017)   No facility-administered encounter medications on file as of 12/25/2017.      Patient Active Problem List   Diagnosis Date Noted  . Nephrolithiasis 11/21/2017  . Osteoporosis 11/21/2017  . Abnormal anal Papanicolaou smear 11/21/2017  . Hx of hepatitis C 11/21/2017  . 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 Tachycardia 11/21/2017  . GERD (gastroesophageal reflux disease) 11/21/2017  . Menopausal disorder 11/21/2017  . History of difficult venous access 11/07/2017  . Human immunodeficiency virus (HIV) disease (HCC) 10/22/2017     Health Maintenance Due  Topic Date Due  . COLONOSCOPY  07/12/2006  . INFLUENZA VACCINE  12/14/2017     Review of Systems Per hpi otherwise 12 point ros is negative Physical Exam   BP 123/79   Pulse 83   Temp 98.4 F (36.9 C)   Wt 130 lb 6.4 oz (59.1 kg)   BMI 23.10 kg/m   Physical Exam  Constitutional:  oriented to  person, place, and time. appears well-developed and well-nourished. No distress.  HENT: [place]/AT, PERRLA, no scleral icterus Mouth/Throat: Oropharynx is clear and moist. No oropharyngeal exudate.  Cardiovascular: Normal rate, regular rhythm and normal heart sounds. Exam reveals no gallop and no friction rub.  No murmur heard.  Neck = supple, no nuchal rigidity. Soft outpouching at base of neck ? Seroma vs. lipoma Lymphadenopathy: no cervical adenopathy. No axillary adenopathy Neurological: alert and oriented to person, place, and time.  Skin: Skin is warm and dry. No rash noted. No erythema.  Psychiatric:  a normal mood and affect.  behavior is normal.   Lab Results  Component Value Date   CD4TCELL 25 (L) 11/07/2017   Lab Results  Component Value Date   CD4TABS 610 11/07/2017   Lab Results  Component Value Date   HIV1RNAQUANT <20 NOT DETECTED 11/07/2017    CBC Lab Results  Component Value Date   WBC 6.9 02/11/2017   RBC 3.83 (L) 02/11/2017   HGB 12.2 02/11/2017   HCT 34.4 (L) 02/11/2017   PLT 223 02/11/2017   MCV 89.8 02/11/2017   MCH 31.9 02/11/2017   MCHC 35.5 02/11/2017   RDW 13.8 02/11/2017    BMET Lab Results  Component Value Date   NA 139 11/07/2017   K 4.0 11/07/2017   CL 105 11/07/2017   CO2 26 11/07/2017   GLUCOSE 80 11/07/2017   BUN 16 11/07/2017   CREATININE 1.02 (H) 11/07/2017   CALCIUM 9.6 11/07/2017   GFRNONAA 59 (L) 11/07/2017   GFRAA 69 11/07/2017      Assessment and Plan  hiv disease= will check viral load today. She has difficult venous access and at her past clinic they would do periodic arterial sticks to get her blood. She was able to give blood through venous stick of veins in foot at her visit here in June.  History of treated hep c = previously treated. We will need liver ultrasound of hcc surveillance  Headache = needs open mri (Due to anxiety) for evaluation of arachnoid cyst (hx). Last imaged roughly 3 years ago . Needs follow up for neurosurgery. Will check CMP-- will need to also let radiology know that she has difficult venous access.  oa = continue on ibuprofen on full stomach. will check cr function as well to see no aki. Will need to review if it is localized to hips to see if it is related to AVN.  GERD = refill on meds  Nausea = will have her take her biktarvy at bedtime nad may not have as much nausea

## 2017-12-26 LAB — T-HELPER CELL (CD4) - (RCID CLINIC ONLY)
CD4 % Helper T Cell: 27 % — ABNORMAL LOW (ref 33–55)
CD4 T Cell Abs: 550 /uL (ref 400–2700)

## 2017-12-27 ENCOUNTER — Ambulatory Visit: Payer: Medicaid Other | Admitting: Internal Medicine

## 2017-12-27 LAB — COMPLETE METABOLIC PANEL WITH GFR
AG RATIO: 1.3 (calc) (ref 1.0–2.5)
ALKALINE PHOSPHATASE (APISO): 54 U/L (ref 33–130)
ALT: 16 U/L (ref 6–29)
AST: 23 U/L (ref 10–35)
Albumin: 4.6 g/dL (ref 3.6–5.1)
BILIRUBIN TOTAL: 0.6 mg/dL (ref 0.2–1.2)
BUN: 17 mg/dL (ref 7–25)
CHLORIDE: 103 mmol/L (ref 98–110)
CO2: 28 mmol/L (ref 20–32)
Calcium: 10 mg/dL (ref 8.6–10.4)
Creat: 0.98 mg/dL (ref 0.50–0.99)
GFR, EST AFRICAN AMERICAN: 72 mL/min/{1.73_m2} (ref >=60)
GFR, Est Non African American: 62 mL/min/{1.73_m2} (ref >=60)
Globulin: 3.5 g/dL (calc) (ref 1.9–3.7)
Glucose, Bld: 72 mg/dL (ref 65–99)
POTASSIUM: 3.8 mmol/L (ref 3.5–5.3)
SODIUM: 140 mmol/L (ref 135–146)
TOTAL PROTEIN: 8.1 g/dL (ref 6.1–8.1)

## 2017-12-27 LAB — HIV-1 RNA QUANT-NO REFLEX-BLD
HIV 1 RNA Quant: <20 copies/mL — AB
HIV-1 RNA Quant, Log: <1.3 Log copies/mL — AB

## 2017-12-27 LAB — LIPID PANEL
CHOL/HDL RATIO: 2.7 (calc) (ref <5.0)
CHOLESTEROL: 172 mg/dL (ref <200)
HDL: 63 mg/dL (ref >50)
LDL CHOLESTEROL (CALC): 90 mg/dL
Non-HDL Cholesterol (Calc): 109 mg/dL (calc) (ref <130)
TRIGLYCERIDES: 99 mg/dL (ref <150)

## 2017-12-30 ENCOUNTER — Other Ambulatory Visit: Payer: Self-pay | Admitting: Internal Medicine

## 2017-12-30 DIAGNOSIS — M810 Age-related osteoporosis without current pathological fracture: Secondary | ICD-10-CM

## 2017-12-30 DIAGNOSIS — B20 Human immunodeficiency virus [HIV] disease: Secondary | ICD-10-CM

## 2018-01-02 ENCOUNTER — Telehealth: Payer: Self-pay

## 2018-01-02 NOTE — Telephone Encounter (Signed)
Informed radiology patient has poor venous access and they will need the IV team for assistance.     Patient is to inform the front desk that she has poor access upon arrival.   Laurell Josephsammy K King, RN

## 2018-01-03 NOTE — Telephone Encounter (Signed)
Patient called Debra Freeman, left message stating she needs to have an OPEN MRI only, asked that her orders be changed so [facility]  Sent message to Timmothy SoursAshley Rehner, updated Patient Coordination notes. Andree CossHowell, Kamyia Thomason M, RN

## 2018-01-08 ENCOUNTER — Ambulatory Visit: Payer: Medicaid Other | Admitting: Internal Medicine

## 2018-01-08 ENCOUNTER — Ambulatory Visit (HOSPITAL_COMMUNITY): Payer: Medicaid Other | Attending: Internal Medicine

## 2018-01-11 ENCOUNTER — Other Ambulatory Visit (HOSPITAL_COMMUNITY): Payer: Medicaid Other

## 2018-01-11 ENCOUNTER — Telehealth: Payer: Self-pay | Admitting: *Deleted

## 2018-01-11 NOTE — Telephone Encounter (Signed)
Patient called to advise she is having a problem with her hemorrhoids and [facility] or a suppository for the issue. Advised will ask the provider and give her a call back.

## 2018-01-20 ENCOUNTER — Encounter (HOSPITAL_COMMUNITY): Payer: Self-pay

## 2018-01-20 ENCOUNTER — Emergency Department (HOSPITAL_COMMUNITY)
Admission: EM | Admit: 2018-01-20 | Discharge: 2018-01-20 | Disposition: A | Discharge status: Home / Self Care | Payer: Medicaid Other | Source: Home / Self Care | Attending: Emergency Medicine | Admitting: Emergency Medicine

## 2018-01-20 ENCOUNTER — Other Ambulatory Visit: Payer: Self-pay

## 2018-01-20 ENCOUNTER — Emergency Department (HOSPITAL_COMMUNITY)
Admission: EM | Admit: 2018-01-20 | Discharge: 2018-01-20 | Disposition: A | Discharge status: Home / Self Care | Payer: Medicaid Other | Attending: Emergency Medicine | Admitting: Emergency Medicine

## 2018-01-20 DIAGNOSIS — Z79899 Other long term (current) drug therapy: Secondary | ICD-10-CM | POA: Insufficient documentation

## 2018-01-20 DIAGNOSIS — B852 Pediculosis, unspecified: Secondary | ICD-10-CM | POA: Insufficient documentation

## 2018-01-20 DIAGNOSIS — L299 Pruritus, unspecified: Secondary | ICD-10-CM | POA: Insufficient documentation

## 2018-01-20 DIAGNOSIS — Z5321 Procedure and treatment not carried out due to patient leaving prior to being seen by health care provider: Secondary | ICD-10-CM | POA: Diagnosis not present

## 2018-01-20 DIAGNOSIS — R21 Rash and other nonspecific skin eruption: Secondary | ICD-10-CM

## 2018-01-20 DIAGNOSIS — Z87891 Personal history of nicotine dependence: Secondary | ICD-10-CM

## 2018-01-20 MED ORDER — LORAZEPAM 1 MG PO TABS
1.0000 mg | ORAL_TABLET | Freq: Once | ORAL | Status: AC
  Administered 2018-01-20: 1 mg via ORAL
  Filled 2018-01-20: qty 1

## 2018-01-20 MED ORDER — PERMETHRIN 5 % EX CREA
TOPICAL_CREAM | Freq: Once | CUTANEOUS | Status: AC
  Administered 2018-01-20: 1 via TOPICAL
  Filled 2018-01-20: qty 60

## 2018-01-20 MED ORDER — LORAZEPAM 1 MG PO TABS: 1.0000 mg | ORAL_TABLET | Freq: Once | ORAL | Status: DC

## 2018-01-20 MED ORDER — PERMETHRIN 5 % EX CREA: TOPICAL_CREAM | CUTANEOUS | 1 refills | Status: DC

## 2018-01-20 NOTE — Discharge Instructions (Addendum)
Please apply medication all over head.You may repeat this process in 1 week. I have provided information, please read following discharge papers.

## 2018-01-20 NOTE — ED Triage Notes (Signed)
Patient states she feels like something is biting her under skin "all over." patient states  she was given a cream and pills for lice from a previous visit to an UC.

## 2018-01-20 NOTE — ED Triage Notes (Signed)
Patient complains of itching that she relates to some type of insect bites. States seen at [facility] for same and no diagnosis. Reports worst bilateral axilla and groin area. Small raised area x 1 to left elbow, NAD

## 2018-01-20 NOTE — ED Provider Notes (Signed)
[facility]-EMERGENCY DEPT Provider Note   CSN: [id number] Arrival date & time: 01/20/18  0855     History   Chief Complaint Chief Complaint  Patient presents with  . insect bites    HPI Debra Freeman is a 62 y.o. female.  62 y.o female with a PMH of HIV presents to the ED with a chief complaint of multiple insect bites x 3 weeks. She reports he was seen at the urgent care a week ago and prescribed permetherin and ivermectin for bed bugs but states no relieve in symptoms. She describes the sensation of having insect bites her throughout her body and head. She has tried soaking her sheets in hot water, drying it with heat, along cleaning the house but states no improvement in her condition.  Reports multiple bites to shoulders, all over body, head, pubic area.She denies any fever, abdominal pain or other complaints.      Past Medical History:  Diagnosis Date  . Depression   . GERD (gastroesophageal reflux disease)   . HIV disease (HCC) 1989   [place], [place]  . Kidney stones   . Menopausal disorder   . Osteoporosis   . Tachycardia     Patient Active Problem List   Diagnosis Date Noted  . Nephrolithiasis 11/21/2017  . Osteoporosis 11/21/2017  . Abnormal anal Papanicolaou smear 11/21/2017  . Hx of hepatitis C 11/21/2017  . 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 Tachycardia 11/21/2017  . GERD (gastroesophageal reflux disease) 11/21/2017  . Menopausal disorder 11/21/2017  . History of difficult venous access 11/07/2017  . Human immunodeficiency virus (HIV) disease (HCC) 10/22/2017    Past Surgical History:  Procedure Laterality Date  . c section x 4    . kidney stones removed       OB History   None      Home Medications    Prior to Admission medications   Medication Sig Start Date End Date Taking? Authorizing Provider  bictegravir-emtricitabine-tenofovir AF (BIKTARVY) 50-200-25 MG TABS tablet Take 1 tablet by mouth  daily. 11/21/17  Yes Judyann Munson, MD  gabapentin (NEURONTIN) 300 MG capsule Take 1 capsule (300 mg total) by mouth 3 (three) times daily. Start with taking 1 tab daily for 7 days followed by 2x/day for 7 days, then 11/21/17  Yes Judyann Munson, MD  ibuprofen (ADVIL,MOTRIN) 800 MG tablet Take 1 tablet (800 mg total) by mouth every 8 (eight) hours as needed. 12/25/17  Yes Judyann Munson, MD  ivermectin (STROMECTOL) 3 MG TABS tablet Take 12 mg by mouth See admin instructions. Take 4 tabs (12mg ) PO every 7 days for 3 weeks 01/11/18  Yes [provider]  omeprazole (PRILOSEC) 40 MG capsule TAKE 1 CAPSULE BY MOUTH EVERY DAY 12/25/17  Yes Judyann Munson, MD  ondansetron (ZOFRAN) 4 MG tablet TAKE 1 TABLET BY MOUTH EVERY 6 HOURS AS NEEDED FOR NAUSEA AND VOMITING 12/25/17  Yes Judyann Munson, MD  Prenatal Vit-Fe Fumarate-FA (PRENATAL VITAMIN PLUS LOW IRON) 27-1 MG TABS TAKE 1 TABLET BY MOUTH DAILY AT NOON. SEPARATE FROM BIKTARVY BY 4 HOURS 01/01/18  Yes Judyann Munson, MD  QUEtiapine (SEROQUEL) 25 MG tablet Take 1 tablet (25 mg total) by mouth at bedtime. 12/25/17  Yes Judyann Munson, MD  valACYclovir (VALTREX) 1000 MG tablet Take 1 tablet (1,000 mg total) by mouth every 8 (eight) hours. If needed for outbreak 12/25/17  Yes Judyann Munson, MD  permethrin (ELIMITE) 5 % cream Apply to affected area once 01/20/18  Claude Manges, PA-C  promethazine (PHENERGAN) 25 MG tablet Take 1 tablet (25 mg total) by mouth every 8 (eight) hours as needed for nausea or vomiting. Patient not taking: Reported on 01/20/2018 12/19/17   Judyann Munson, MD    Family History Family History  Problem Relation Age of Onset  . Arthritis Mother   . Lung cancer Father     Social History Social History   Tobacco Use  . Smoking status: Former Games developer  . Smokeless tobacco: Never Used  Substance Use Topics  . Alcohol use: Yes    Comment: special occassions  . Drug use: No     Allergies   Mefoxin [cefoxitin]   Review of  Systems Review of Systems  Constitutional: Negative for fever.  Gastrointestinal: Negative for abdominal pain.  Skin: Positive for rash.  All other systems reviewed and are negative.    Physical Exam Updated Vital Signs BP 106/75 (BP Location: Left Arm)   Pulse (!) 110   Temp 98.1 F (36.7 C) (Oral)   Resp 16   Ht 5\' 3"  (1.6 m)   Wt 59 kg   SpO2 98%   BMI 23.03 kg/m   Physical Exam  Constitutional: She is oriented to person, place, and time. She appears well-developed and well-nourished.  HENT:  Head: Normocephalic and atraumatic. Hair is abnormal.  Multiple lice white eggs present on scalp.   Eyes: Pupils are equal, round, and reactive to light.  Neck: Normal range of motion. Neck supple.  Cardiovascular: Normal heart sounds.  Pulmonary/Chest: Breath sounds normal.  Abdominal: Soft.  Musculoskeletal: She exhibits no tenderness or deformity.  Neurological: She is alert and oriented to person, place, and time.  Skin: Skin is warm and dry. Capillary refill takes less than 2 seconds.  Nursing note and vitals reviewed.    ED Treatments / Results  Labs (all labs ordered are listed, but only abnormal results are displayed) Labs Reviewed - No data to display  EKG None  Radiology No results found.  Procedures Procedures (including critical care time)  Medications Ordered in ED Medications  LORazepam (ATIVAN) tablet 1 mg (has no administration in time range)  permethrin (ELIMITE) 5 % cream (has no administration in time range)     Initial Impression / Assessment and Plan / ED Course  I have reviewed the triage vital signs and the nursing notes.  Pertinent labs & imaging results that were available during my care of the patient were reviewed by me and considered in my medical decision making (see chart for details).     Patient presents with multiple complaints of animal bites. She has been seen for this complaint and has tried permethrin and ivermetin without  relieve in symptoms.I have examine patients and see the multiple white eggs on the head, no bite marks to neck or other parts of the body.  I explained to patient that she needs to thoroughly clean her home and wash her sheets with warm water along with dry them with  Heat.  She will try permethrin again, she states the medication is too costly I have provided this for patient in the ED.  Return precautions have been given.  Final Clinical Impressions(s) / ED Diagnoses   Final diagnoses:  Lice infestation    ED Discharge Orders         Ordered    permethrin (ELIMITE) 5 % cream     01/20/18 1023           Hazen Brumett, [place], PA-C  01/20/18 1029    Lorre Nick, MD 01/22/18 2321

## 2018-02-07 ENCOUNTER — Telehealth: Payer: Self-pay | Admitting: Behavioral Health

## 2018-02-07 ENCOUNTER — Other Ambulatory Visit: Payer: Self-pay | Admitting: Behavioral Health

## 2018-02-07 DIAGNOSIS — K649 Unspecified hemorrhoids: Secondary | ICD-10-CM

## 2018-02-07 MED ORDER — HYDROCORTISONE ACETATE 25 MG RE SUPP: 25.0000 mg | Freq: Two times a day (BID) | RECTAL | 1 refills | Status: DC

## 2018-02-07 NOTE — Telephone Encounter (Signed)
Called [place] and informed her a  suppository rx was electronically sent in for [facility] to [facility] on [address].  Ania verbalized understanding. [name] RN

## 2018-02-07 NOTE — Telephone Encounter (Signed)
Debra Freeman called today stating she spoke to Dr. Drue Second on the phone and was supposed to be ordered suppositories for hemorrhoids to [facility] on [address]  Prescription was not available per the pharmacy.  Informed patient,  Dr. Drue Second would be made aware to see if the prescription is appropriate to be called in.  Will update patient. [name] RN

## 2018-02-09 ENCOUNTER — Encounter (HOSPITAL_COMMUNITY): Payer: Self-pay

## 2018-02-09 ENCOUNTER — Ambulatory Visit (HOSPITAL_COMMUNITY)
Admission: EM | Admit: 2018-02-09 | Discharge: 2018-02-09 | Disposition: A | Discharge status: Home / Self Care | Payer: Medicaid Other | Attending: Family Medicine | Admitting: Family Medicine

## 2018-02-09 DIAGNOSIS — B850 Pediculosis due to Pediculus humanus capitis: Secondary | ICD-10-CM

## 2018-02-09 MED ORDER — IVERMECTIN 3 MG PO TABS: 200.0000 ug/kg | ORAL_TABLET | Freq: Every day | ORAL | 0 refills | Status: DC

## 2018-02-09 NOTE — ED Provider Notes (Signed)
[facility]    CSN: [id number] Arrival date & time: 02/09/18  4098     History   Chief Complaint Chief Complaint  Patient presents with  . Head Lice    HPI Debra Freeman is a 62 y.o. female.   This 62 year old HIV-positive woman comes in for persistent diffuse itching despite permethrin cream.  She was diagnosed with pediculosis capitis and treated with permethrin 10 days ago.  Symptoms have not abated.  She has washed all of her linen.  She is also wash her clothing.   Patient was recently put on the new HIV medicine and this may be causing some of her itchiness.     Past Medical History:  Diagnosis Date  . Depression   . GERD (gastroesophageal reflux disease)   . HIV disease (HCC) 1989   [place], [place]  . Kidney stones   . Menopausal disorder   . Osteoporosis   . Tachycardia     Patient Active Problem List   Diagnosis Date Noted  . Nephrolithiasis 11/21/2017  . Osteoporosis 11/21/2017  . Abnormal anal Papanicolaou smear 11/21/2017  . Hx of hepatitis C 11/21/2017  . 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 Tachycardia 11/21/2017  . GERD (gastroesophageal reflux disease) 11/21/2017  . Menopausal disorder 11/21/2017  . History of difficult venous access 11/07/2017  . Human immunodeficiency virus (HIV) disease (HCC) 10/22/2017    Past Surgical History:  Procedure Laterality Date  . c section x 4    . kidney stones removed      OB History   None      Home Medications    Prior to Admission medications   Medication Sig Start Date End Date Taking? Authorizing Provider  bictegravir-emtricitabine-tenofovir AF (BIKTARVY) 50-200-25 MG TABS tablet Take 1 tablet by mouth daily. 11/21/17   Judyann Munson, MD  gabapentin (NEURONTIN) 300 MG capsule Take 1 capsule (300 mg total) by mouth 3 (three) times daily. Start with taking 1 tab daily for 7 days followed by 2x/day for 7 days, then 11/21/17   Judyann Munson, MD  hydrocortisone  (ANUSOL-HC) 25 MG suppository Place 1 suppository (25 mg total) rectally 2 (two) times daily. 02/07/18   Judyann Munson, MD  ivermectin (STROMECTOL) 3 MG TABS tablet Take 4 tablets (12,000 mcg total) by mouth daily for 7 days. 02/09/18 02/16/18  Elvina Sidle, MD  omeprazole (PRILOSEC) 40 MG capsule TAKE 1 CAPSULE BY MOUTH EVERY DAY 12/25/17   Judyann Munson, MD  ondansetron (ZOFRAN) 4 MG tablet TAKE 1 TABLET BY MOUTH EVERY 6 HOURS AS NEEDED FOR NAUSEA AND VOMITING 12/25/17   Judyann Munson, MD  Prenatal Vit-Fe Fumarate-FA (PRENATAL VITAMIN PLUS LOW IRON) 27-1 MG TABS TAKE 1 TABLET BY MOUTH DAILY AT NOON. SEPARATE FROM BIKTARVY BY 4 HOURS 01/01/18   Judyann Munson, MD  QUEtiapine (SEROQUEL) 25 MG tablet Take 1 tablet (25 mg total) by mouth at bedtime. 12/25/17   Judyann Munson, MD  valACYclovir (VALTREX) 1000 MG tablet Take 1 tablet (1,000 mg total) by mouth every 8 (eight) hours. If needed for outbreak 12/25/17   Judyann Munson, MD    Family History Family History  Problem Relation Age of Onset  . Arthritis Mother   . Lung cancer Father     Social History Social History   Tobacco Use  . Smoking status: Former Games developer  . Smokeless tobacco: Never Used  Substance Use Topics  . Alcohol use: Yes    Comment: special occassions  . Drug use: No  Allergies   Mefoxin [cefoxitin]   Review of Systems Review of Systems  Skin: Positive for rash.  All other systems reviewed and are negative.    Physical Exam Triage Vital Signs ED Triage Vitals  Enc Vitals Group     BP 02/09/18 0938 (!) 156/102     Pulse Rate 02/09/18 0938 95     Resp 02/09/18 0938 16     Temp 02/09/18 0938 97.6 F (36.4 C)     Temp Source 02/09/18 0938 Oral     SpO2 02/09/18 0938 100 %     Weight 02/09/18 0940 130 lb (59 kg)     Height --      Head Circumference --      Peak Flow --      Pain Score --      Pain Loc --      Pain Edu? --      Excl. in GC? --    No data found.  Updated Vital Signs BP  (!) 156/102 (BP Location: Left Arm)   Pulse 95   Temp 97.6 F (36.4 C) (Oral)   Resp 16   Wt 59 kg   SpO2 100%   BMI 23.03 kg/m    Physical Exam  Constitutional: She is oriented to person, place, and time. She appears well-developed and well-nourished.  HENT:  Right Ear: External ear normal.  Left Ear: External ear normal.  Canals and TMs are normal  Eyes: Conjunctivae are normal.  Neck: Normal range of motion. Neck supple.  Pulmonary/Chest: Effort normal.  Musculoskeletal: Normal range of motion.  Lymphadenopathy:    She has cervical adenopathy.  Neurological: She is alert and oriented to person, place, and time.  Skin: Skin is warm and dry.  Few nits noted in shaved scalp  Nursing note and vitals reviewed.    UC Treatments / Results  Labs (all labs ordered are listed, but only abnormal results are displayed) Labs Reviewed - No data to display  EKG None  Radiology No results found.  Procedures Procedures (including critical care time)  Medications Ordered in UC Medications - No data to display  Initial Impression / Assessment and Plan / UC Course  I have reviewed the triage vital signs and the nursing notes.  Pertinent labs & imaging results that were available during my care of the patient were reviewed by me and considered in my medical decision making (see chart for details).    Final Clinical Impressions(s) / UC Diagnoses   Final diagnoses:  Pediculosis capitis     Discharge Instructions     Wash thoroughly daily with soap and water    ED Prescriptions    Medication Sig Dispense Auth. Provider   ivermectin (STROMECTOL) 3 MG TABS tablet Take 4 tablets (12,000 mcg total) by mouth daily for 7 days. 28 tablet Elvina Sidle, MD     Controlled Substance Prescriptions [place] Controlled Substance Registry consulted? Not Applicable   Elvina Sidle, MD 02/09/18 [phone]

## 2018-02-09 NOTE — Discharge Instructions (Addendum)
Wash thoroughly daily with soap and water

## 2018-02-09 NOTE — ED Triage Notes (Signed)
Pt states she has had head lice for a month. Pt states she went to a fast med for this issue 3 weeks ago.

## 2018-02-15 ENCOUNTER — Ambulatory Visit (HOSPITAL_COMMUNITY)
Admission: EM | Admit: 2018-02-15 | Discharge: 2018-02-15 | Disposition: A | Discharge status: Home / Self Care | Payer: Medicaid Other | Attending: Family Medicine | Admitting: Family Medicine

## 2018-02-15 ENCOUNTER — Encounter (HOSPITAL_COMMUNITY): Payer: Self-pay | Admitting: Emergency Medicine

## 2018-02-15 DIAGNOSIS — R21 Rash and other nonspecific skin eruption: Secondary | ICD-10-CM | POA: Diagnosis not present

## 2018-02-15 MED ORDER — PREDNISONE 20 MG PO TABS: 20.0000 mg | ORAL_TABLET | Freq: Every day | ORAL | 0 refills | Status: DC

## 2018-02-15 MED ORDER — HYDROXYZINE HCL 25 MG PO TABS: 25.0000 mg | ORAL_TABLET | Freq: Four times a day (QID) | ORAL | 0 refills | Status: DC

## 2018-02-15 NOTE — Discharge Instructions (Addendum)
I believe the pins and needles feeling is a type of inflammation in the skin after one of these infestation kind of skin problem and it should clear in the next 48 to 72 hour.

## 2018-02-15 NOTE — ED Triage Notes (Signed)
PT has been struggling with "hair and body lice" for weeks.

## 2018-02-15 NOTE — ED Provider Notes (Signed)
[facility]    CSN: [id number] Arrival date & time: 02/15/18  1217     History   Chief Complaint Chief Complaint  Patient presents with  . Appointment  . Rash    HPI Debra Freeman is a 62 y.o. female.   This is an HIV-positive woman who comes in for repeat evaluation of pediculosis capitis.  She was treated first with permethrin and then, 7 days ago, ivermectin.  She continues to be bothered by pins-and-needles type feeling on her scalp and back, thinking that she is being repeatedly bitten.  She is seeing some white 1 mm flakes in her car and she thinks that is more bugs.  She also has itching underneath her nails.     Past Medical History:  Diagnosis Date  . Depression   . GERD (gastroesophageal reflux disease)   . HIV disease (HCC) 1989   [place], [place]  . Kidney stones   . Menopausal disorder   . Osteoporosis   . Tachycardia     Patient Active Problem List   Diagnosis Date Noted  . Nephrolithiasis 11/21/2017  . Osteoporosis 11/21/2017  . Abnormal anal Papanicolaou smear 11/21/2017  . Hx of hepatitis C 11/21/2017  . 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 Tachycardia 11/21/2017  . GERD (gastroesophageal reflux disease) 11/21/2017  . Menopausal disorder 11/21/2017  . History of difficult venous access 11/07/2017  . Human immunodeficiency virus (HIV) disease (HCC) 10/22/2017    Past Surgical History:  Procedure Laterality Date  . c section x 4    . kidney stones removed      OB History   None      Home Medications    Prior to Admission medications   Medication Sig Start Date End Date Taking? Authorizing Provider  bictegravir-emtricitabine-tenofovir AF (BIKTARVY) 50-200-25 MG TABS tablet Take 1 tablet by mouth daily. 11/21/17   Judyann Munson, MD  gabapentin (NEURONTIN) 300 MG capsule Take 1 capsule (300 mg total) by mouth 3 (three) times daily. Start with taking 1 tab daily for 7 days followed by 2x/day for 7 days, then  11/21/17   Judyann Munson, MD  hydrocortisone (ANUSOL-HC) 25 MG suppository Place 1 suppository (25 mg total) rectally 2 (two) times daily. 02/07/18   Judyann Munson, MD  ivermectin (STROMECTOL) 3 MG TABS tablet Take 4 tablets (12,000 mcg total) by mouth daily for 7 days. 02/09/18 02/16/18  Elvina Sidle, MD  omeprazole (PRILOSEC) 40 MG capsule TAKE 1 CAPSULE BY MOUTH EVERY DAY 12/25/17   Judyann Munson, MD  ondansetron (ZOFRAN) 4 MG tablet TAKE 1 TABLET BY MOUTH EVERY 6 HOURS AS NEEDED FOR NAUSEA AND VOMITING 12/25/17   Judyann Munson, MD  Prenatal Vit-Fe Fumarate-FA (PRENATAL VITAMIN PLUS LOW IRON) 27-1 MG TABS TAKE 1 TABLET BY MOUTH DAILY AT NOON. SEPARATE FROM BIKTARVY BY 4 HOURS 01/01/18   Judyann Munson, MD  QUEtiapine (SEROQUEL) 25 MG tablet Take 1 tablet (25 mg total) by mouth at bedtime. 12/25/17   Judyann Munson, MD  valACYclovir (VALTREX) 1000 MG tablet Take 1 tablet (1,000 mg total) by mouth every 8 (eight) hours. If needed for outbreak 12/25/17   Judyann Munson, MD    Family History Family History  Problem Relation Age of Onset  . Arthritis Mother   . Lung cancer Father     Social History Social History   Tobacco Use  . Smoking status: Former Games developer  . Smokeless tobacco: Never Used  Substance Use Topics  . Alcohol use: Yes  Comment: special occassions  . Drug use: No     Allergies   Mefoxin [cefoxitin]   Review of Systems Review of Systems   Physical Exam Triage Vital Signs ED Triage Vitals [02/15/18 1228]  Enc Vitals Group     BP 126/75     Pulse Rate 84     Resp 16     Temp 98.3 F (36.8 C)     Temp Source Oral     SpO2 100 %     Weight      Height      Head Circumference      Peak Flow      Pain Score      Pain Loc      Pain Edu?      Excl. in GC?    No data found.  Updated Vital Signs BP 126/75   Pulse 84   Temp 98.3 F (36.8 C) (Oral)   Resp 16   SpO2 100%    Physical Exam  Constitutional: She is oriented to person, place, and  time. She appears well-developed and well-nourished.  HENT:  Head: Normocephalic and atraumatic.  Eyes: Pupils are equal, round, and reactive to light. Conjunctivae are normal.  Neck: Normal range of motion. Neck supple.  Pulmonary/Chest: Effort normal.  Musculoskeletal: Normal range of motion.  Neurological: She is alert and oriented to person, place, and time.  Skin: Skin is warm and dry.  Just above the right helix there are 2 different 1 to 2 mm scabs were the skin is been excoriated, but otherwise there are no papules, notes, or obvious excoriations on the scalp.  Psychiatric:  I spent most of the visit reassuring the patient that I see no sign for continuing infestation.  I think some of her worry is coming from the anxiety of possibly having continued infestation.  Nursing note and vitals reviewed.    UC Treatments / Results  Labs (all labs ordered are listed, but only abnormal results are displayed) Labs Reviewed - No data to display  EKG None  Radiology No results found.  Procedures Procedures (including critical care time)  Medications Ordered in UC Medications - No data to display  Initial Impression / Assessment and Plan / UC Course  I have reviewed the triage vital signs and the nursing notes.  Pertinent labs & imaging results that were available during my care of the patient were reviewed by me and considered in my medical decision making (see chart for details).     Final Clinical Impressions(s) / UC Diagnoses   Final diagnoses:  None   Discharge Instructions   None    ED Prescriptions    None     Controlled Substance Prescriptions [place] Controlled Substance Registry consulted? Not Applicable   Elvina Sidle, MD 02/15/18 1249

## 2018-02-15 NOTE — ED Notes (Signed)
Bed: UC01 Expected date:  Expected time:  Means of arrival:  Comments: Appointments 

## 2018-03-01 ENCOUNTER — Telehealth: Payer: Self-pay | Admitting: Behavioral Health

## 2018-03-01 NOTE — Telephone Encounter (Signed)
Patient called stating she has tried to go a dermatologist but needs a referral.  Patient is requesting a dermatology referral. [name] RN

## 2018-03-02 NOTE — Telephone Encounter (Signed)
Can you call patient stating we can do the referral but can she remind me what she would like dermatology to evaluate

## 2018-03-05 NOTE — Telephone Encounter (Signed)
Attempted to call [name] left generic voicemail to call the office back.  Following up on why patient needs dermatology referral. [name] RN

## 2018-03-05 NOTE — Telephone Encounter (Signed)
Patient is returning Ashley's call regarding referral to dermatology. Patient states she recently had lice for a couple of months and was able to receive treatment. Has recently noticed that her scalp is dry, has bumps on her head, and feels like something is still crawling on her head. Patient was told by a doctor at [facility] that she should be evaluated by a Dermatologist. Patient would still like for a referral for Dermatology.  Lorenso Courier, [place]

## 2018-03-06 ENCOUNTER — Ambulatory Visit (INDEPENDENT_AMBULATORY_CARE_PROVIDER_SITE_OTHER): Payer: Medicaid Other | Admitting: Pharmacist

## 2018-03-06 DIAGNOSIS — B20 Human immunodeficiency virus [HIV] disease: Secondary | ICD-10-CM | POA: Diagnosis present

## 2018-03-06 MED ORDER — EMTRICITABINE-TENOFOVIR AF 200-25 MG PO TABS: 1.0000 | ORAL_TABLET | Freq: Every day | ORAL | 5 refills | Status: DC

## 2018-03-06 MED ORDER — RALTEGRAVIR POTASSIUM 600 MG PO TABS: 2.0000 | ORAL_TABLET | Freq: Every day | ORAL | 5 refills | Status: DC

## 2018-03-06 NOTE — Patient Instructions (Addendum)
It was great seeing you today!  Stop Biktarvy.   Start taking Isentress two tablets once daily and Descovy one tablet once daily.  Make sure to take all three tablets every day.

## 2018-03-06 NOTE — Progress Notes (Signed)
HPI: Debra Freeman is a 62 y.o. female who presents to clinic to discuss concerns about Biktarvy.  Patient Active Problem List   Diagnosis Date Noted  . Nephrolithiasis 11/21/2017  . Osteoporosis 11/21/2017  . Abnormal anal Papanicolaou smear 11/21/2017  . Hx of hepatitis C 11/21/2017  . 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 Tachycardia 11/21/2017  . GERD (gastroesophageal reflux disease) 11/21/2017  . Menopausal disorder 11/21/2017  . History of difficult venous access 11/07/2017  . Human immunodeficiency virus (HIV) disease (HCC) 10/22/2017    Patient's Medications  New Prescriptions   No medications on file  Previous Medications   BICTEGRAVIR-EMTRICITABINE-TENOFOVIR AF (BIKTARVY) 50-200-25 MG TABS TABLET    Take 1 tablet by mouth daily.   GABAPENTIN (NEURONTIN) 300 MG CAPSULE    Take 1 capsule (300 mg total) by mouth 3 (three) times daily. Start with taking 1 tab daily for 7 days followed by 2x/day for 7 days, then   HYDROCORTISONE (ANUSOL-HC) 25 MG SUPPOSITORY    Place 1 suppository (25 mg total) rectally 2 (two) times daily.   HYDROXYZINE (ATARAX/VISTARIL) 25 MG TABLET    Take 1 tablet (25 mg total) by mouth every 6 (six) hours.   OMEPRAZOLE (PRILOSEC) 40 MG CAPSULE    TAKE 1 CAPSULE BY MOUTH EVERY DAY   ONDANSETRON (ZOFRAN) 4 MG TABLET    TAKE 1 TABLET BY MOUTH EVERY 6 HOURS AS NEEDED FOR NAUSEA AND VOMITING   PREDNISONE (DELTASONE) 20 MG TABLET    Take 1 tablet (20 mg total) by mouth daily with breakfast. One daily with food   PRENATAL VIT-FE FUMARATE-FA (PRENATAL VITAMIN PLUS LOW IRON) 27-1 MG TABS    TAKE 1 TABLET BY MOUTH DAILY AT NOON. SEPARATE FROM BIKTARVY BY 4 HOURS   QUETIAPINE (SEROQUEL) 25 MG TABLET    Take 1 tablet (25 mg total) by mouth at bedtime.   VALACYCLOVIR (VALTREX) 1000 MG TABLET    Take 1 tablet (1,000 mg total) by mouth every 8 (eight) hours. If needed for outbreak  Modified Medications   No medications on file  Discontinued  Medications   No medications on file    Allergies: Allergies  Allergen Reactions  . Mefoxin [Cefoxitin]     Past Medical History: Past Medical History:  Diagnosis Date  . Depression   . GERD (gastroesophageal reflux disease)   . HIV disease (HCC) 1989   [place], [place]  . Kidney stones   . Menopausal disorder   . Osteoporosis   . Tachycardia     Social History: Social History   Socioeconomic History  . Marital status: Legally Separated    Spouse name: Not on file  . Number of children: Not on file  . Years of education: Not on file  . Highest education level: Not on file  Occupational History  . Not on file  Social Needs  . Financial resource strain: Not on file  . Food insecurity:    Worry: Not on file    Inability: Not on file  . Transportation needs:    Medical: Not on file    Non-medical: Not on file  Tobacco Use  . Smoking status: Former Games developer  . Smokeless tobacco: Never Used  Substance and Sexual Activity  . Alcohol use: Yes    Comment: special occassions  . Drug use: No  . Sexual activity: Not on file  Lifestyle  . Physical activity:    Days per week: Not on file    Minutes per session:  Not on file  . Stress: Not on file  Relationships  . Social connections:    Talks on phone: Not on file    Gets together: Not on file    Attends religious service: Not on file    Active member of club or organization: Not on file    Attends meetings of clubs or organizations: Not on file    Relationship status: Not on file  Other Topics Concern  . Not on file  Social History Narrative  . Not on file    Labs: Lab Results  Component Value Date   HIV1RNAQUANT <20 DETECTED (A) 12/25/2017   HIV1RNAQUANT <20 NOT DETECTED 11/07/2017   CD4TABS 550 12/25/2017   CD4TABS 610 11/07/2017    RPR and STI No results found for: LABRPR, RPRTITER  STI Results GC CT  11/07/2017 Negative Negative    Hepatitis B No results found for: HEPBSAB, HEPBSAG,  HEPBCAB Hepatitis C No results found for: HEPCAB, HCVRNAPCRQN Hepatitis A No results found for: HAV Lipids: Lab Results  Component Value Date   CHOL 172 12/25/2017   TRIG 99 12/25/2017   HDL 63 12/25/2017   CHOLHDL 2.7 12/25/2017   LDLCALC 90 12/25/2017    Current HIV Regimen: Biktarvy   Assessment: Debra Freeman is not doing well on Biktarvy. She complains about itching under nails and head. She describes some bumps on her head. She reports numbness and tingling under her nails. Her symptoms started when she started taking the Biktarvy. She has been seen by the emergency department twice and has been treated for lice. She states she does not wish to take Biktarvy anymore and would like to be switched to something else. She is agreeable to retry her previous regimen of Isentress and Descovy. Counseled patient to take all three tablets around the same time each day and not miss any doses. Cautioned on possible side effects the first week or so including nausea, diarrhea, and headaches. Discussed with patient to call clinic if she starts a new medication or herbal supplement. Follow up with Dr. Drue Second on 11/11.   Plan: - stop Biktarvy  - start Isentress and Descovy - follow up with Dr. Drue Second 11/11 @ [address] [place] D PGY1 Pharmacy Resident  03/06/2018      11:24 AM

## 2018-03-06 NOTE — Telephone Encounter (Addendum)
Debra Freeman called this morning stating she now thinks her Debra Freeman is the reason for her rash.  She states she is going to stop her Biktarvy and states she cannot wait to see Dr. Drue Second.  Made her an appointment with pharmacy today to further dicuss her medication. [name] RN

## 2018-03-09 ENCOUNTER — Other Ambulatory Visit: Payer: Self-pay

## 2018-03-09 DIAGNOSIS — R21 Rash and other nonspecific skin eruption: Secondary | ICD-10-CM

## 2018-03-09 NOTE — Progress Notes (Signed)
Patient called back regarding referral for Dermatology. Patient states she is still experiencing itching on scalp. Feels like there is something crawling on her head.  Lorenso Courier, [place]

## 2018-03-12 ENCOUNTER — Telehealth: Payer: Self-pay | Admitting: *Deleted

## 2018-03-12 NOTE — Telephone Encounter (Signed)
Patient returned call to check and see if her referral for Dermatology. She advised she really needs this visit as soon as possible. Advised her will let Drue Second know and once she places the referral someone will give her a call back.

## 2018-03-14 NOTE — Telephone Encounter (Signed)
I think I am confused abit. Is she anxious that her appt is not soon enough?If so, we can try to call different derm centers. I would try [facility] or the practice that [facility] runs

## 2018-03-26 ENCOUNTER — Encounter: Payer: Self-pay | Admitting: Internal Medicine

## 2018-03-26 ENCOUNTER — Ambulatory Visit (INDEPENDENT_AMBULATORY_CARE_PROVIDER_SITE_OTHER): Payer: Medicaid Other | Admitting: Internal Medicine

## 2018-03-26 VITALS — BP 163/79 | HR 83 | Temp 97.7°F | Ht 63.0 in | Wt 141.0 lb

## 2018-03-26 DIAGNOSIS — B20 Human immunodeficiency virus [HIV] disease: Secondary | ICD-10-CM | POA: Diagnosis not present

## 2018-03-26 DIAGNOSIS — F418 Other specified anxiety disorders: Secondary | ICD-10-CM

## 2018-03-26 DIAGNOSIS — Z8619 Personal history of other infectious and parasitic diseases: Secondary | ICD-10-CM

## 2018-03-26 DIAGNOSIS — K649 Unspecified hemorrhoids: Secondary | ICD-10-CM | POA: Diagnosis present

## 2018-03-26 MED ORDER — CLONAZEPAM 0.5 MG PO TABS: 0.5000 mg | ORAL_TABLET | Freq: Two times a day (BID) | ORAL | 0 refills | Status: DC | PRN

## 2018-03-26 NOTE — Progress Notes (Signed)
RFV: fpllow up for hiv disease  Patient ID: Debra Freeman, female   DOB: 1955/08/04, 62 y.o.   MRN: [id number]  HPI  62yo F with hiv disease, CD 4 count of 550/VL<20, who had been on biktarvy but started to have diffuse itching to scalp that she attributed to biktarvy. She was changed to descovy-RLG BID in mid October. Here for follow up. She is doing better with the new regimen  Having hemorrhoids, noticed bleeding with BM when she strains. She is not using any stool softener  She has had to be treated for lice twice, shaved her head but still having sensation of crawling to her scalp. Saw dermatology last week and didn't see any insect/nits. She was given hydroxyzine 50mg  TId PRN, which still has not completely work.  She is anxious that she had ongoing infestation for greater than a month.  Outpatient Encounter Medications as of 03/26/2018  Medication Sig  . emtricitabine-tenofovir AF (DESCOVY) 200-25 MG tablet Take 1 tablet by mouth daily.  Marland Kitchen gabapentin (NEURONTIN) 300 MG capsule Take 1 capsule (300 mg total) by mouth 3 (three) times daily. Start with taking 1 tab daily for 7 days followed by 2x/day for 7 days, then  . hydrocortisone (ANUSOL-HC) 25 MG suppository Place 1 suppository (25 mg total) rectally 2 (two) times daily.  . hydrOXYzine (ATARAX/VISTARIL) 25 MG tablet Take 1 tablet (25 mg total) by mouth every 6 (six) hours.  Marland Kitchen omeprazole (PRILOSEC) 40 MG capsule TAKE 1 CAPSULE BY MOUTH EVERY DAY  . ondansetron (ZOFRAN) 4 MG tablet TAKE 1 TABLET BY MOUTH EVERY 6 HOURS AS NEEDED FOR NAUSEA AND VOMITING  . predniSONE (DELTASONE) 20 MG tablet Take 1 tablet (20 mg total) by mouth daily with breakfast. One daily with food  . Prenatal Vit-Fe Fumarate-FA (PRENATAL VITAMIN PLUS LOW IRON) 27-1 MG TABS TAKE 1 TABLET BY MOUTH DAILY AT NOON. SEPARATE FROM BIKTARVY BY 4 HOURS  . QUEtiapine (SEROQUEL) 25 MG tablet Take 1 tablet (25 mg total) by mouth at bedtime.  . Raltegravir Potassium  (ISENTRESS HD) 600 MG TABS Take 2 tablets by mouth daily.  . valACYclovir (VALTREX) 1000 MG tablet Take 1 tablet (1,000 mg total) by mouth every 8 (eight) hours. If needed for outbreak   No facility-administered encounter medications on file as of 03/26/2018.      Patient Active Problem List   Diagnosis Date Noted  . Nephrolithiasis 11/21/2017  . Osteoporosis 11/21/2017  . Abnormal anal Papanicolaou smear 11/21/2017  . Hx of hepatitis C 11/21/2017  . 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 Tachycardia 11/21/2017  . GERD (gastroesophageal reflux disease) 11/21/2017  . Menopausal disorder 11/21/2017  . History of difficult venous access 11/07/2017  . Human immunodeficiency virus (HIV) disease (HCC) 10/22/2017     Health Maintenance Due  Topic Date Due  . COLONOSCOPY  07/12/2006  . INFLUENZA VACCINE  12/14/2017    Soc hx: drinks 2 natural lights in the evening Review of Systems Per hpi otherwise 12 point ros is negative   Physical Exam  BP (!) 163/79   Pulse 83   Temp 97.7 F (36.5 C)   Ht 5\' 3"  (1.6 m)   Wt 141 lb (64 kg)   BMI 24.98 kg/m  Physical Exam  Constitutional:  oriented to person, place, and time. appears well-developed and well-nourished. No distress.  HENT: [place]/AT, PERRLA, no scleral icterus. No nits noted on thorough scalp exam Mouth/Throat: Oropharynx is clear and moist. No oropharyngeal exudate.  Cardiovascular:  Normal rate, regular rhythm and normal heart sounds. Exam reveals no gallop and no friction rub.  No murmur heard.  Pulmonary/Chest: Effort normal and breath sounds normal. No respiratory distress.  has no wheezes.  Neck = supple, no nuchal rigidity Abdominal: Soft. Bowel sounds are normal.  exhibits no distension. There is no tenderness.  Lymphadenopathy: no cervical adenopathy. No axillary adenopathy Neurological: alert and oriented to person, place, and time.  Skin: Skin is warm and dry. No rash noted. No erythema.  Psychiatric: a  normal mood and affect.  behavior is normal.    Lab Results  Component Value Date   CD4TCELL 27 (L) 12/25/2017   Lab Results  Component Value Date   CD4TABS 550 12/25/2017   CD4TABS 610 11/07/2017   Lab Results  Component Value Date   HIV1RNAQUANT <20 DETECTED (A) 12/25/2017   No results found for: HEPBSAB No results found for: RPR, LABRPR  CBC Lab Results  Component Value Date   WBC 6.9 02/11/2017   RBC 3.83 (L) 02/11/2017   HGB 12.2 02/11/2017   HCT 34.4 (L) 02/11/2017   PLT 223 02/11/2017   MCV 89.8 02/11/2017   MCH 31.9 02/11/2017   MCHC 35.5 02/11/2017   RDW 13.8 02/11/2017    BMET Lab Results  Component Value Date   NA 140 12/25/2017   K 3.8 12/25/2017   CL 103 12/25/2017   CO2 28 12/25/2017   GLUCOSE 72 12/25/2017   BUN 17 12/25/2017   CREATININE 0.98 12/25/2017   CALCIUM 10.0 12/25/2017   GFRNONAA 62 12/25/2017   GFRAA 72 12/25/2017      Assessment and Plan  hiv disease= continue on descovy plus raltegravir. ( is supect that the initial reason why she changed her meds possibly was unrelated, probably due to lice) for now, continue on descovy plus RLG.  Lice = no nits that could be found during this exam. She has had repeated course of treatment but I don't think she has a reinfestation at this time.  Pruritis = could be residual of premethrin. But suspect she has anxiety associated with recent infestation  Situational anxiety surrounding recent infestation = recommended her to her that she will need to believe that she no longer has lice. Can give her short course of anxiolytics but will only give #14 tabs of klonopin and mention that she should anticipate that this improves over the next few weeks. Be reassured that she is not having reinfestation after she has treated herself and home. Decrease alcohol use at this time while on klonopin.  Spent 40 min with patient with greater than 50% explaining/reassuring no reinfestation

## 2018-03-29 ENCOUNTER — Telehealth: Payer: Self-pay

## 2018-03-29 NOTE — Telephone Encounter (Signed)
Lequita HaltMorgan from [facility] called office regarding referral for patient. Lequita HaltMorgan states she needs more information before patient can be scheduled with their office. Will fax demographics and insurance to [phone]. Confirmation was received. Lorenso CourierJose L Maldon[place], [place]

## 2018-04-24 DIAGNOSIS — F5101 Primary insomnia: Secondary | ICD-10-CM | POA: Insufficient documentation

## 2018-04-26 ENCOUNTER — Other Ambulatory Visit: Payer: Self-pay | Admitting: Family Medicine

## 2018-04-26 DIAGNOSIS — M810 Age-related osteoporosis without current pathological fracture: Secondary | ICD-10-CM

## 2018-05-26 ENCOUNTER — Other Ambulatory Visit: Payer: Self-pay | Admitting: Internal Medicine

## 2018-05-26 ENCOUNTER — Telehealth: Payer: Self-pay | Admitting: Internal Medicine

## 2018-05-26 DIAGNOSIS — J069 Acute upper respiratory infection, unspecified: Secondary | ICD-10-CM

## 2018-05-26 MED ORDER — AZITHROMYCIN 250 MG PO TABS: ORAL_TABLET | ORAL | 0 refills | Status: DC

## 2018-05-26 NOTE — Telephone Encounter (Signed)
I received a call from Debra Freeman. She has been ill for 4 days with cough productive of yellow sputum, chills and sore throat. She has not had fever or SOB. I sent in a script for azithromycin.

## 2018-06-25 ENCOUNTER — Encounter: Payer: Self-pay | Admitting: Internal Medicine

## 2018-06-25 ENCOUNTER — Ambulatory Visit (INDEPENDENT_AMBULATORY_CARE_PROVIDER_SITE_OTHER): Payer: Medicaid Other | Admitting: Internal Medicine

## 2018-06-25 VITALS — BP 120/79 | HR 73 | Temp 98.7°F | Ht 63.5 in | Wt 131.5 lb

## 2018-06-25 DIAGNOSIS — R21 Rash and other nonspecific skin eruption: Secondary | ICD-10-CM | POA: Diagnosis not present

## 2018-06-25 DIAGNOSIS — B20 Human immunodeficiency virus [HIV] disease: Secondary | ICD-10-CM | POA: Diagnosis not present

## 2018-06-25 DIAGNOSIS — Z79899 Other long term (current) drug therapy: Secondary | ICD-10-CM | POA: Diagnosis not present

## 2018-06-25 NOTE — Progress Notes (Signed)
Patient ID: Debra Freeman, female   DOB: 1955-08-28, 63 y.o.   MRN: [id number]  HPI Debra Freeman is a 63yo F with hiv disease, CD 4 count of 550/VL<20 in August. On descovy plus RLG. Doing well with adherence.  We last saw her in November, concern for residual pruritis from having lice.   Doing well. Has established PCP  Going to [place] to help her mom at beginning of march since her mother is getting joint replacement   Patient Active Problem List   Diagnosis Date Noted  . URI (upper respiratory infection) 05/26/2018  . Nephrolithiasis 11/21/2017  . Osteoporosis 11/21/2017  . Abnormal anal Papanicolaou smear 11/21/2017  . Hx of hepatitis C 11/21/2017  . 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 Tachycardia 11/21/2017  . GERD (gastroesophageal reflux disease) 11/21/2017  . Menopausal disorder 11/21/2017  . History of difficult venous access 11/07/2017  . Human immunodeficiency virus (HIV) disease (HCC) 10/22/2017     Health Maintenance Due  Topic Date Due  . COLONOSCOPY  07/12/2006  . INFLUENZA VACCINE  12/14/2017    Social History   Tobacco Use  . Smoking status: Former Games developermoker  . Smokeless tobacco: Never Used  Substance Use Topics  . Alcohol use: Yes    Comment: special occassions  . Drug use: No   Review of Systems 12 point ros is otherwise negative, except rash Physical Exam   BP 120/79   Pulse 73   Temp 98.7 F (37.1 C) (Oral)   Ht 5' 3.5" (1.613 m)   Wt 131 lb 8 oz (59.6 kg)   BMI 22.93 kg/m   Physical Exam  Constitutional:  oriented to person, place, and time. appears well-developed and well-nourished. No distress.  HENT: [place]/AT, PERRLA, no scleral icterus Mouth/Throat: Oropharynx is clear and moist. No oropharyngeal exudate.  Cardiovascular: Normal rate, regular rhythm and normal heart sounds. Exam reveals no gallop and no friction rub.  No murmur heard.  Pulmonary/Chest: Effort normal and breath sounds normal. No respiratory distress.   has no wheezes.  Neck = supple, no nuchal rigidity Lymphadenopathy: no cervical adenopathy. No axillary adenopathy Neurological: alert and oriented to person, place, and time.  Skin: Skin is warm and dry. No rash noted. No erythema.  Psychiatric: a normal mood and affect.  behavior is normal.   Lab Results  Component Value Date   CD4TCELL 27 (L) 12/25/2017   Lab Results  Component Value Date   CD4TABS 550 12/25/2017   CD4TABS 610 11/07/2017   Lab Results  Component Value Date   HIV1RNAQUANT <20 DETECTED (A) 12/25/2017   No results found for: HEPBSAB No results found for: RPR, LABRPR  CBC Lab Results  Component Value Date   WBC 6.9 02/11/2017   RBC 3.83 (L) 02/11/2017   HGB 12.2 02/11/2017   HCT 34.4 (L) 02/11/2017   PLT 223 02/11/2017   MCV 89.8 02/11/2017   MCH 31.9 02/11/2017   MCHC 35.5 02/11/2017   RDW 13.8 02/11/2017    BMET Lab Results  Component Value Date   NA 140 12/25/2017   K 3.8 12/25/2017   CL 103 12/25/2017   CO2 28 12/25/2017   GLUCOSE 72 12/25/2017   BUN 17 12/25/2017   CREATININE 0.98 12/25/2017   CALCIUM 10.0 12/25/2017   GFRNONAA 62 12/25/2017   GFRAA 72 12/25/2017      Assessment and Plan  - hiv disease= well controled continue with descovy plus RLG. Will check labs at next visit this summer  -  long term medication management = cr stable  - health maintenance = getting pap,mammo and colonoscopy this coming month arranged through pcp  - rash = recommended to switch to dove soap unscented detergents  Alcohol use = recommend to decrease intake due to risk of dependence

## 2018-06-27 ENCOUNTER — Ambulatory Visit: Payer: Medicaid Other | Admitting: Internal Medicine

## 2018-07-03 ENCOUNTER — Other Ambulatory Visit: Payer: Self-pay | Admitting: Family Medicine

## 2018-07-03 DIAGNOSIS — Z1231 Encounter for screening mammogram for malignant neoplasm of breast: Secondary | ICD-10-CM

## 2018-07-29 ENCOUNTER — Other Ambulatory Visit: Payer: Self-pay | Admitting: Internal Medicine

## 2018-08-02 ENCOUNTER — Other Ambulatory Visit: Payer: Medicaid Other

## 2018-08-31 ENCOUNTER — Ambulatory Visit: Payer: Medicaid Other

## 2018-08-31 ENCOUNTER — Other Ambulatory Visit: Payer: Medicaid Other

## 2018-09-20 ENCOUNTER — Other Ambulatory Visit: Payer: Self-pay | Admitting: Pharmacist

## 2018-09-20 DIAGNOSIS — B20 Human immunodeficiency virus [HIV] disease: Secondary | ICD-10-CM

## 2018-11-01 ENCOUNTER — Ambulatory Visit
Admission: RE | Admit: 2018-11-01 | Discharge: 2018-11-01 | Disposition: A | Discharge status: Home / Self Care | Payer: Medicaid Other | Source: Ambulatory Visit | Attending: Family Medicine | Admitting: Family Medicine

## 2018-11-01 ENCOUNTER — Other Ambulatory Visit: Payer: Self-pay

## 2018-11-01 DIAGNOSIS — M810 Age-related osteoporosis without current pathological fracture: Secondary | ICD-10-CM

## 2018-11-01 DIAGNOSIS — Z1231 Encounter for screening mammogram for malignant neoplasm of breast: Secondary | ICD-10-CM

## 2018-12-12 ENCOUNTER — Other Ambulatory Visit: Payer: Self-pay | Admitting: Internal Medicine

## 2018-12-14 ENCOUNTER — Other Ambulatory Visit: Payer: Self-pay | Admitting: Internal Medicine

## 2018-12-20 ENCOUNTER — Telehealth: Payer: Self-pay | Admitting: Internal Medicine

## 2018-12-20 NOTE — Telephone Encounter (Signed)
COVID-19 Pre-Screening Questions: ° °Do you currently have a fever (>100 °F), chills or unexplained body aches? N ° °Are you currently experiencing new cough, shortness of breath, sore throat, runny nose? N °•  °Have you recently travelled outside the state of [place] in the last 14 days? N  °•  °Have you been in contact with someone that is currently pending confirmation of Covid19 testing or has been confirmed to have the Covid19 virus?  N ° °**If the patient answers NO to ALL questions -  advise the patient to please call the clinic before coming to the office should any symptoms develop.  ° ° ° °

## 2018-12-24 ENCOUNTER — Other Ambulatory Visit: Payer: Self-pay

## 2018-12-24 ENCOUNTER — Encounter: Payer: Self-pay | Admitting: Internal Medicine

## 2018-12-24 ENCOUNTER — Ambulatory Visit (INDEPENDENT_AMBULATORY_CARE_PROVIDER_SITE_OTHER): Payer: Medicaid Other | Admitting: Internal Medicine

## 2018-12-24 VITALS — BP 143/79 | HR 77 | Temp 98.4°F

## 2018-12-24 DIAGNOSIS — Z79899 Other long term (current) drug therapy: Secondary | ICD-10-CM

## 2018-12-24 DIAGNOSIS — M810 Age-related osteoporosis without current pathological fracture: Secondary | ICD-10-CM | POA: Diagnosis not present

## 2018-12-24 DIAGNOSIS — R110 Nausea: Secondary | ICD-10-CM | POA: Diagnosis not present

## 2018-12-24 DIAGNOSIS — R630 Anorexia: Secondary | ICD-10-CM | POA: Diagnosis not present

## 2018-12-24 DIAGNOSIS — B20 Human immunodeficiency virus [HIV] disease: Secondary | ICD-10-CM | POA: Diagnosis present

## 2018-12-24 MED ORDER — MEGESTROL ACETATE 625 MG/5ML PO SUSP: 625.0000 mg | Freq: Every day | ORAL | 0 refills | Status: DC

## 2018-12-24 NOTE — Progress Notes (Signed)
RFV: follow up for hiv disease  Patient ID: Debra Freeman, female   DOB: 1956/01/27, 63 y.o.   MRN: [id number]  HPI Debra KidneyDebra is a 63yo F with HIV disease, descovy-isentress 1200mg  daily. She is reports some loss of appetite but not necessarily significant weight loss.has felt some increased anxiety due to covid-19. No exposure that she knows of. She states that she did have respiratory infection that improved.  Outpatient Encounter Medications as of 12/24/2018  Medication Sig  . clonazePAM (KLONOPIN) 0.5 MG tablet Take 1 tablet (0.5 mg total) by mouth 2 (two) times daily as needed for anxiety.  . DESCOVY 200-25 MG tablet TAKE 1 TABLET BY MOUTH EVERY DAY  . ibuprofen (ADVIL) 800 MG tablet TAKE 1 TABLET BY MOUTH EVERY 8 HOURS AS NEEDED  . ISENTRESS HD 600 MG TABS TAKE 2 TABLETS BY MOUTH EVERY DAY  . omeprazole (PRILOSEC) 40 MG capsule TAKE 1 CAPSULE BY MOUTH EVERY DAY  . ondansetron (ZOFRAN) 4 MG tablet TAKE 1 TABLET BY MOUTH EVERY 6 HOURS AS NEEDED FOR NAUSEA AND VOMITING  . Prenatal Vit-Fe Fumarate-FA (PRENATAL VITAMIN PLUS LOW IRON) 27-1 MG TABS TAKE 1 TABLET BY MOUTH DAILY AT NOON. SEPARATE FROM BIKTARVY BY 4 HOURS  . QUEtiapine (SEROQUEL) 25 MG tablet Take 1 tablet (25 mg total) by mouth at bedtime.  . valACYclovir (VALTREX) 1000 MG tablet TAKE 1 TABLET (1,000 MG TOTAL) BY MOUTH EVERY 8 (EIGHT) HOURS. IF NEEDED FOR OUTBREAK  . azithromycin (ZITHROMAX) 250 MG tablet Take 2 tabs by mouth today then 1 tab daily thereafter. (Patient not taking: Reported on 12/24/2018)  . gabapentin (NEURONTIN) 300 MG capsule Take 1 capsule (300 mg total) by mouth 3 (three) times daily. Start with taking 1 tab daily for 7 days followed by 2x/day for 7 days, then (Patient not taking: Reported on 06/25/2018)  . [DISCONTINUED] hydrocortisone (ANUSOL-HC) 25 MG suppository Place 1 suppository (25 mg total) rectally 2 (two) times daily. (Patient not taking: Reported on 12/24/2018)  . [DISCONTINUED] hydrOXYzine  (ATARAX/VISTARIL) 25 MG tablet Take 1 tablet (25 mg total) by mouth every 6 (six) hours. (Patient not taking: Reported on 12/24/2018)  . [DISCONTINUED] predniSONE (DELTASONE) 20 MG tablet Take 1 tablet (20 mg total) by mouth daily with breakfast. One daily with food (Patient not taking: Reported on 12/24/2018)   No facility-administered encounter medications on file as of 12/24/2018.      Patient Active Problem List   Diagnosis Date Noted  . URI (upper respiratory infection) 05/26/2018  . Nephrolithiasis 11/21/2017  . Osteoporosis 11/21/2017  . Abnormal anal Papanicolaou smear 11/21/2017  . Hx of hepatitis C 11/21/2017  . 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 Tachycardia 11/21/2017  . GERD (gastroesophageal reflux disease) 11/21/2017  . Menopausal disorder 11/21/2017  . History of difficult venous access 11/07/2017  . Human immunodeficiency virus (HIV) disease (HCC) 10/22/2017     Health Maintenance Due  Topic Date Due  . COLONOSCOPY  07/12/2006  . INFLUENZA VACCINE  12/15/2018     Review of Systems 12 point ros is negative except what is mentioned above Physical Exam   BP (!) 143/79   Pulse 77   Temp 98.4 F (36.9 C)   Physical Exam  Constitutional:  oriented to person, place, and time. appears well-developed and well-nourished. No distress.  HENT: [place]/AT, PERRLA, no scleral icterus Mouth/Throat: Oropharynx is clear and moist. No oropharyngeal exudate.  Cardiovascular: Normal rate, regular rhythm and normal heart sounds. Exam reveals no gallop and no  friction rub.  No murmur heard.  Pulmonary/Chest: Effort normal and breath sounds normal. No respiratory distress.  has no wheezes.  Neck = supple, no nuchal rigidity Abdominal: Soft. Bowel sounds are normal.  exhibits no distension. There is no tenderness.  Lymphadenopathy: no cervical adenopathy. No axillary adenopathy Neurological: alert and oriented to person, place, and time.  Skin: Skin is warm and dry. No  rash noted. No erythema.  Psychiatric: a normal mood and affect.  behavior is normal.   Lab Results  Component Value Date   CD4TCELL 27 (L) 12/25/2017   Lab Results  Component Value Date   CD4TABS 550 12/25/2017   CD4TABS 610 11/07/2017   Lab Results  Component Value Date   HIV1RNAQUANT <20 DETECTED (A) 12/25/2017   CBC Lab Results  Component Value Date   WBC 6.9 02/11/2017   RBC 3.83 (L) 02/11/2017   HGB 12.2 02/11/2017   HCT 34.4 (L) 02/11/2017   PLT 223 02/11/2017   MCV 89.8 02/11/2017   MCH 31.9 02/11/2017   MCHC 35.5 02/11/2017   RDW 13.8 02/11/2017    BMET Lab Results  Component Value Date   NA 140 12/25/2017   K 3.8 12/25/2017   CL 103 12/25/2017   CO2 28 12/25/2017   GLUCOSE 72 12/25/2017   BUN 17 12/25/2017   CREATININE 0.98 12/25/2017   CALCIUM 10.0 12/25/2017   GFRNONAA 62 12/25/2017   GFRAA 72 12/25/2017      Assessment and Plan  hiv dsease = well controlled in past labs. Will have her do cd4count/vl/cmp/cbc with diff  Loss of appetite/weight =patient would like to do a trial of megace to see if that will help her symptoms  Intermittent nausea = can give refills on zofran  Long term medication management = will check cr function  rtc in 3 months

## 2018-12-25 ENCOUNTER — Other Ambulatory Visit: Payer: Medicaid Other

## 2018-12-25 ENCOUNTER — Telehealth: Payer: Self-pay

## 2018-12-25 DIAGNOSIS — M810 Age-related osteoporosis without current pathological fracture: Secondary | ICD-10-CM

## 2018-12-25 DIAGNOSIS — B20 Human immunodeficiency virus [HIV] disease: Secondary | ICD-10-CM

## 2018-12-25 NOTE — Telephone Encounter (Signed)
PA-status update denied. Insurance prefers  Megestrol 400mg /65ml of Megestrol 463ml/20ml prior to ES strength Megestrol.  Routing to provider to make aware. Patient and provider will also be made aware within 7-10 business days via certified mail. Eugenia Mcalpine, LPN

## 2018-12-25 NOTE — Telephone Encounter (Signed)
Received prior auth request from [facility] for Megestrol Acetate 625mg /33ml suspension PA ref # [id number] to check status of PA within 24 hours currently under clinical review Eugenia Mcalpine, LPN

## 2018-12-26 ENCOUNTER — Other Ambulatory Visit: Payer: Self-pay | Admitting: Internal Medicine

## 2018-12-26 DIAGNOSIS — K219 Gastro-esophageal reflux disease without esophagitis: Secondary | ICD-10-CM

## 2018-12-26 LAB — T-HELPER CELL (CD4) - (RCID CLINIC ONLY)
CD4 % Helper T Cell: 25 % — ABNORMAL LOW (ref 33–65)
CD4 T Cell Abs: 373 /uL — ABNORMAL LOW (ref 400–1790)

## 2018-12-28 ENCOUNTER — Telehealth: Payer: Self-pay | Admitting: *Deleted

## 2018-12-28 LAB — COMPLETE METABOLIC PANEL WITH GFR
AG Ratio: 1.2 (calc) (ref 1.0–2.5)
ALT: 13 U/L (ref 6–29)
AST: 19 U/L (ref 10–35)
Albumin: 4.2 g/dL (ref 3.6–5.1)
Alkaline phosphatase (APISO): 38 U/L (ref 37–153)
BUN: 17 mg/dL (ref 7–25)
CO2: 26 mmol/L (ref 20–32)
Calcium: 9.2 mg/dL (ref 8.6–10.4)
Chloride: 107 mmol/L (ref 98–110)
Creat: 0.86 mg/dL (ref 0.50–0.99)
GFR, Est African American: 84 mL/min/{1.73_m2} (ref >=60)
GFR, Est Non African American: 72 mL/min/{1.73_m2} (ref >=60)
Globulin: 3.5 g/dL (calc) (ref 1.9–3.7)
Glucose, Bld: 65 mg/dL (ref 65–99)
Potassium: 3.8 mmol/L (ref 3.5–5.3)
Sodium: 141 mmol/L (ref 135–146)
Total Bilirubin: 0.4 mg/dL (ref 0.2–1.2)
Total Protein: 7.7 g/dL (ref 6.1–8.1)

## 2018-12-28 LAB — CBC WITH DIFFERENTIAL/PLATELET
Absolute Monocytes: 361 cells/uL (ref 200–950)
Basophils Absolute: 19 cells/uL (ref 0–200)
Basophils Relative: 0.5 %
Eosinophils Absolute: 42 cells/uL (ref 15–500)
Eosinophils Relative: 1.1 %
HCT: 35.2 % (ref 35.0–45.0)
Hemoglobin: 11.9 g/dL (ref 11.7–15.5)
Lymphs Abs: 1539 cells/uL (ref 850–3900)
MCH: 32.6 pg (ref 27.0–33.0)
MCHC: 33.8 g/dL (ref 32.0–36.0)
MCV: 96.4 fL (ref 80.0–100.0)
MPV: 10.7 fL (ref 7.5–12.5)
Monocytes Relative: 9.5 %
Neutro Abs: 1839 cells/uL (ref 1500–7800)
Neutrophils Relative %: 48.4 %
Platelets: 277 10*3/uL (ref 140–400)
RBC: 3.65 10*6/uL — ABNORMAL LOW (ref 3.80–5.10)
RDW: 12.4 % (ref 11.0–15.0)
Total Lymphocyte: 40.5 %
WBC: 3.8 10*3/uL (ref 3.8–10.8)

## 2018-12-28 LAB — VITAMIN D 1,25 DIHYDROXY
Vitamin D 1, 25 (OH)2 Total: 50 pg/mL (ref 18–72)
Vitamin D2 1, 25 (OH)2: <8 pg/mL
Vitamin D3 1, 25 (OH)2: 50 pg/mL

## 2018-12-28 LAB — HIV-1 RNA QUANT-NO REFLEX-BLD
HIV 1 RNA Quant: <20 copies/mL
HIV-1 RNA Quant, Log: <1.3 Log copies/mL

## 2018-12-28 LAB — VITAMIN D 25 HYDROXY (VIT D DEFICIENCY, FRACTURES): Vit D, 25-Hydroxy: 32 ng/mL (ref 30–100)

## 2018-12-28 LAB — RPR: RPR Ser Ql: NONREACTIVE

## 2018-12-28 NOTE — Telephone Encounter (Signed)
Patient sent a mychart and has some concerns about her labs she request a call from Dr Baxter Flattery. Advised her I have forwarded the message to provider.

## 2018-12-31 ENCOUNTER — Other Ambulatory Visit: Payer: Self-pay | Admitting: Internal Medicine

## 2018-12-31 DIAGNOSIS — K219 Gastro-esophageal reflux disease without esophagitis: Secondary | ICD-10-CM

## 2018-12-31 DIAGNOSIS — B20 Human immunodeficiency virus [HIV] disease: Secondary | ICD-10-CM

## 2019-01-01 ENCOUNTER — Other Ambulatory Visit: Payer: Self-pay | Admitting: Internal Medicine

## 2019-01-01 ENCOUNTER — Other Ambulatory Visit: Payer: Self-pay | Admitting: *Deleted

## 2019-01-01 DIAGNOSIS — B20 Human immunodeficiency virus [HIV] disease: Secondary | ICD-10-CM

## 2019-01-01 DIAGNOSIS — K219 Gastro-esophageal reflux disease without esophagitis: Secondary | ICD-10-CM

## 2019-01-01 MED ORDER — ONDANSETRON HCL 4 MG PO TABS: 4.0000 mg | ORAL_TABLET | Freq: Two times a day (BID) | ORAL | 5 refills | Status: DC

## 2019-01-01 MED ORDER — MEGESTROL ACETATE 400 MG/10ML PO SUSP: 400.0000 mg | Freq: Every day | ORAL | 0 refills | Status: DC

## 2019-01-05 ENCOUNTER — Other Ambulatory Visit: Payer: Self-pay | Admitting: Internal Medicine

## 2019-01-05 DIAGNOSIS — B20 Human immunodeficiency virus [HIV] disease: Secondary | ICD-10-CM

## 2019-01-05 DIAGNOSIS — M810 Age-related osteoporosis without current pathological fracture: Secondary | ICD-10-CM

## 2019-01-09 ENCOUNTER — Telehealth: Payer: Self-pay

## 2019-01-09 NOTE — Telephone Encounter (Signed)
Initiated Prior Auth for QUEtiapine Fumarate 25 mg tablets.  Called [employer] to start process, I had to stop process, no psychiatric diagnosis in patients chart.   Will reattempt with diagnosis.    Lenore Cordia, [place]

## 2019-01-23 ENCOUNTER — Other Ambulatory Visit: Payer: Medicaid Other

## 2019-01-28 ENCOUNTER — Other Ambulatory Visit: Payer: Medicaid Other

## 2019-02-11 ENCOUNTER — Other Ambulatory Visit: Payer: Medicaid Other

## 2019-02-11 ENCOUNTER — Other Ambulatory Visit: Payer: Self-pay

## 2019-02-11 DIAGNOSIS — B20 Human immunodeficiency virus [HIV] disease: Secondary | ICD-10-CM

## 2019-02-11 LAB — CBC WITH DIFFERENTIAL/PLATELET
Absolute Monocytes: 336 cells/uL (ref 200–950)
Basophils Absolute: 9 cells/uL (ref 0–200)
Basophils Relative: 0.2 %
Eosinophils Absolute: 41 cells/uL (ref 15–500)
Eosinophils Relative: 0.9 %
HCT: 35.2 % (ref 35.0–45.0)
Hemoglobin: 12.1 g/dL (ref 11.7–15.5)
Lymphs Abs: 2001 cells/uL (ref 850–3900)
MCH: 32.9 pg (ref 27.0–33.0)
MCHC: 34.4 g/dL (ref 32.0–36.0)
MCV: 95.7 fL (ref 80.0–100.0)
MPV: 10.9 fL (ref 7.5–12.5)
Monocytes Relative: 7.3 %
Neutro Abs: 2213 cells/uL (ref 1500–7800)
Neutrophils Relative %: 48.1 %
Platelets: 251 10*3/uL (ref 140–400)
RBC: 3.68 10*6/uL — ABNORMAL LOW (ref 3.80–5.10)
RDW: 13.1 % (ref 11.0–15.0)
Total Lymphocyte: 43.5 %
WBC: 4.6 10*3/uL (ref 3.8–10.8)

## 2019-02-12 ENCOUNTER — Other Ambulatory Visit: Payer: Self-pay | Admitting: Family Medicine

## 2019-02-12 ENCOUNTER — Other Ambulatory Visit: Payer: Self-pay | Admitting: Internal Medicine

## 2019-02-12 DIAGNOSIS — G930 Cerebral cysts: Secondary | ICD-10-CM

## 2019-02-12 LAB — T-HELPER CELL (CD4) - (RCID CLINIC ONLY)
CD4 % Helper T Cell: 30 % — ABNORMAL LOW (ref 33–65)
CD4 T Cell Abs: 612 /uL (ref 400–1790)

## 2019-02-13 NOTE — Telephone Encounter (Signed)
Message sent to Dr. Baxter Flattery to approve or deny refill.  Also asked if the patient needed seen sooner than February 2021.  RN spoke with Dr. Baxter Flattery who approved refill until next visit in February 2021.

## 2019-02-20 ENCOUNTER — Telehealth: Payer: Self-pay

## 2019-02-20 NOTE — Telephone Encounter (Signed)
Incoming Fax from [facility] [address] [place] [address]:  Request for new prescription for Hydrocortisone Suppositories.   Lenore Cordia, [place]

## 2019-02-22 ENCOUNTER — Other Ambulatory Visit: Payer: Self-pay | Admitting: Internal Medicine

## 2019-03-05 ENCOUNTER — Telehealth: Payer: Self-pay

## 2019-03-05 DIAGNOSIS — K649 Unspecified hemorrhoids: Secondary | ICD-10-CM

## 2019-03-05 NOTE — Telephone Encounter (Signed)
Routed to Dr. Baxter Flattery as phone note. Medication no longer on profile.

## 2019-03-05 NOTE — Telephone Encounter (Signed)
Received mychart from patient requesting Rx renewal onhydrocortisone (ANUSOL-HC) 25 MG suppository. This medication was noted to be discontinued from profile. Routing to provided for advise.  Eugenia Mcalpine

## 2019-03-06 ENCOUNTER — Other Ambulatory Visit: Payer: Medicaid Other

## 2019-03-07 ENCOUNTER — Other Ambulatory Visit: Payer: Self-pay | Admitting: Family Medicine

## 2019-03-07 MED ORDER — HYDROCORTISONE ACETATE 25 MG RE SUPP: 25.0000 mg | Freq: Two times a day (BID) | RECTAL | 0 refills | Status: DC

## 2019-03-07 NOTE — Telephone Encounter (Signed)
Okay to renew

## 2019-03-07 NOTE — Addendum Note (Signed)
Addended by: Eugenia Mcalpine on: 03/07/2019 03:00 PM   Modules accepted: Orders

## 2019-03-11 ENCOUNTER — Ambulatory Visit
Admission: RE | Admit: 2019-03-11 | Discharge: 2019-03-11 | Disposition: A | Discharge status: Home / Self Care | Payer: Medicaid Other | Source: Ambulatory Visit | Attending: Family Medicine | Admitting: Family Medicine

## 2019-03-11 ENCOUNTER — Other Ambulatory Visit: Payer: Self-pay

## 2019-03-11 DIAGNOSIS — G930 Cerebral cysts: Secondary | ICD-10-CM

## 2019-03-11 MED ORDER — GADOBENATE DIMEGLUMINE 529 MG/ML IV SOLN
12.0000 mL | Freq: Once | INTRAVENOUS | Status: AC | PRN
  Administered 2019-03-11: 12 mL via INTRAVENOUS

## 2019-03-23 ENCOUNTER — Other Ambulatory Visit: Payer: Self-pay | Admitting: Internal Medicine

## 2019-03-23 DIAGNOSIS — B20 Human immunodeficiency virus [HIV] disease: Secondary | ICD-10-CM

## 2019-05-01 ENCOUNTER — Other Ambulatory Visit: Payer: Self-pay | Admitting: Internal Medicine

## 2019-06-13 ENCOUNTER — Other Ambulatory Visit: Payer: Self-pay | Admitting: Internal Medicine

## 2019-06-24 ENCOUNTER — Other Ambulatory Visit: Payer: Self-pay

## 2019-06-24 ENCOUNTER — Ambulatory Visit: Payer: Medicaid Other | Admitting: Internal Medicine

## 2019-06-24 MED ORDER — IBUPROFEN 800 MG PO TABS: 800.0000 mg | ORAL_TABLET | Freq: Three times a day (TID) | ORAL | 3 refills | Status: DC | PRN

## 2019-07-01 ENCOUNTER — Other Ambulatory Visit: Payer: Self-pay

## 2019-07-01 ENCOUNTER — Encounter: Payer: Self-pay | Admitting: Internal Medicine

## 2019-07-01 ENCOUNTER — Ambulatory Visit (INDEPENDENT_AMBULATORY_CARE_PROVIDER_SITE_OTHER): Payer: Medicaid Other | Admitting: Internal Medicine

## 2019-07-01 VITALS — BP 128/82 | HR 85 | Wt 137.0 lb

## 2019-07-01 DIAGNOSIS — G44029 Chronic cluster headache, not intractable: Secondary | ICD-10-CM | POA: Diagnosis not present

## 2019-07-01 DIAGNOSIS — B20 Human immunodeficiency virus [HIV] disease: Secondary | ICD-10-CM

## 2019-07-01 DIAGNOSIS — Z79899 Other long term (current) drug therapy: Secondary | ICD-10-CM | POA: Diagnosis not present

## 2019-07-01 NOTE — Progress Notes (Signed)
RFV: follow up for hiv disease  Patient ID: Debra Freeman, female   DOB: 05/21/1955, 64 y.o.   MRN: [id number]  HPI Debra Freeman is a 64yo F with well controlled HIV disease, on descovy and RAL. Still has headache doing okay with high dose ibuprofen, takes 800mg  daily. Sees PCP to see if can get a different regimen.denies abdominal discomfort with taking ibuprofen. She has not had any covid scares. Not missed doses of ART.  Outpatient Encounter Medications as of 07/01/2019  Medication Sig  . DESCOVY 200-25 MG tablet TAKE 1 TABLET BY MOUTH EVERY DAY  . hydrocortisone (ANUSOL-HC) 25 MG suppository Place 1 suppository (25 mg total) rectally 2 (two) times daily.  07/03/2019 ibuprofen (ADVIL) 800 MG tablet Take 1 tablet (800 mg total) by mouth every 8 (eight) hours as needed.  . ISENTRESS HD 600 MG TABS TAKE 2 TABLETS BY MOUTH EVERY DAY  . megestrol (MEGACE ES) 625 MG/5ML suspension Take 5 mLs (625 mg total) by mouth daily.  . megestrol (MEGACE) 40 MG/ML suspension TAKE 10 MLS (400 MG TOTAL) BY MOUTH DAILY.  Marland Kitchen omeprazole (PRILOSEC) 40 MG capsule TAKE 1 CAPSULE BY MOUTH EVERY DAY  . ondansetron (ZOFRAN) 4 MG tablet Take 1 tablet (4 mg total) by mouth 2 (two) times daily. Please disregard the Rx and refills sent 12/31/18 for 15 tablets. Patient takes twice a day with her B20 medication.  . Prenatal 27-1 MG TABS TAKE 1 TABLET BY MOUTH DAILY AT NOON. SEPARATE FROM BIKTARVY BY 4 HOURS  . QUEtiapine (SEROQUEL) 25 MG tablet TAKE 1 TABLET BY MOUTH EVERYDAY AT BEDTIME  . valACYclovir (VALTREX) 1000 MG tablet TAKE 1 TABLET (1,000 MG TOTAL) BY MOUTH EVERY 8 (EIGHT) HOURS. IF NEEDED FOR OUTBREAK  . azithromycin (ZITHROMAX) 250 MG tablet Take 2 tabs by mouth today then 1 tab daily thereafter. (Patient not taking: Reported on 12/24/2018)  . clonazePAM (KLONOPIN) 0.5 MG tablet Take 1 tablet (0.5 mg total) by mouth 2 (two) times daily as needed for anxiety.  . gabapentin (NEURONTIN) 300 MG capsule Take 1 capsule (300 mg  total) by mouth 3 (three) times daily. Start with taking 1 tab daily for 7 days followed by 2x/day for 7 days, then (Patient not taking: Reported on 06/25/2018)   No facility-administered encounter medications on file as of 07/01/2019.     Patient Active Problem List   Diagnosis Date Noted  . URI (upper respiratory infection) 05/26/2018  . Primary insomnia 04/24/2018  . Nephrolithiasis 11/21/2017  . Osteoporosis 11/21/2017  . Abnormal anal Papanicolaou smear 11/21/2017  . Hx of hepatitis C 11/21/2017  . 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 Tachycardia 11/21/2017  . GERD (gastroesophageal reflux disease) 11/21/2017  . Menopausal disorder 11/21/2017  . History of difficult venous access 11/07/2017  . Human immunodeficiency virus (HIV) disease (HCC) 10/22/2017     Health Maintenance Due  Topic Date Due  . COLONOSCOPY  07/12/2006  . INFLUENZA VACCINE  12/15/2018    Social History   Tobacco Use  . Smoking status: Former 02/14/2019  . Smokeless tobacco: Never Used  Substance Use Topics  . Alcohol use: Yes    Comment: special occassions  . Drug use: No    Review of Systems Review of Systems  Constitutional: Negative for fever, chills, diaphoresis, activity change, appetite change, fatigue and unexpected weight change.  HENT: Negative for congestion, sore throat, rhinorrhea, sneezing, trouble swallowing and sinus pressure.  Eyes: Negative for photophobia and visual disturbance.  Respiratory: Negative for  cough, chest tightness, shortness of breath, wheezing and stridor.  Cardiovascular: Negative for chest pain, palpitations and leg swelling.  Gastrointestinal: Negative for nausea, vomiting, abdominal pain, diarrhea, constipation, blood in stool, abdominal distention and anal bleeding.  Genitourinary: Negative for dysuria, hematuria, flank pain and difficulty urinating.  Musculoskeletal: Negative for myalgias, back pain, joint swelling, arthralgias and gait problem.   Skin: Negative for color change, pallor, rash and wound.  Neurological: Negative for dizziness, tremors, weakness and light-headedness.  Hematological: Negative for adenopathy. Does not bruise/bleed easily.  Psychiatric/Behavioral: Negative for behavioral problems, confusion, sleep disturbance, dysphoric mood, decreased concentration and agitation.   Physical Exam   BP 128/82   Pulse 85   Wt 137 lb (62.1 kg)   BMI 23.89 kg/m   Physical Exam  Constitutional:  oriented to person, place, and time. appears well-developed and well-nourished. No distress.  HENT: [place]/AT, PERRLA, no scleral icterus Mouth/Throat: Oropharynx is clear and moist. No oropharyngeal exudate.  Cardiovascular: Normal rate, regular rhythm and normal heart sounds. Exam reveals no gallop and no friction rub.  No murmur heard.  Pulmonary/Chest: Effort normal and breath sounds normal. No respiratory distress.  has no wheezes.  Neck = supple, no nuchal rigidity Lymphadenopathy: no cervical adenopathy. No axillary adenopathy Neurological: alert and oriented to person, place, and time.  Skin: Skin is warm and dry. No rash noted. No erythema.  Psychiatric: a normal mood and affect.  behavior is normal.   Lab Results  Component Value Date   CD4TCELL 30 (L) 02/11/2019   Lab Results  Component Value Date   CD4TABS 612 02/11/2019   CD4TABS 373 (L) 12/25/2018   CD4TABS 550 12/25/2017   Lab Results  Component Value Date   HIV1RNAQUANT <20 NOT DETECTED 12/25/2018   No results found for: HEPBSAB Lab Results  Component Value Date   LABRPR NON-REACTIVE 12/25/2018    CBC Lab Results  Component Value Date   WBC 4.6 02/11/2019   RBC 3.68 (L) 02/11/2019   HGB 12.1 02/11/2019   HCT 35.2 02/11/2019   PLT 251 02/11/2019   MCV 95.7 02/11/2019   MCH 32.9 02/11/2019   MCHC 34.4 02/11/2019   RDW 13.1 02/11/2019   LYMPHSABS 2,001 02/11/2019   EOSABS 41 02/11/2019    BMET Lab Results  Component Value Date   NA 141  12/25/2018   K 3.8 12/25/2018   CL 107 12/25/2018   CO2 26 12/25/2018   GLUCOSE 65 12/25/2018   BUN 17 12/25/2018   CREATININE 0.86 12/25/2018   CALCIUM 9.2 12/25/2018   GFRNONAA 72 12/25/2018   GFRAA 84 12/25/2018      Assessment and Plan  Chronic headache = has tried mobic without success, wonder if atenolol would work for her. Sees pcp this week to see if can find alternative instead of ibuprofen. Recommend to take on full stomach  hiv disease = well controlled in the summer 2020, will do labs today. She has difficult venous access, has been getting blood draw from foot  Long term medication management = will check cr  Health maintenance = covid vaccine advocate. Refer to get mammo and pap in June 2020

## 2019-07-01 NOTE — Patient Instructions (Addendum)
  In terms of migraine headaches. - ask dr Everlene Other if he thinks night time atenolol would be helpful?  Mammogram is due in june

## 2019-07-02 LAB — T-HELPER CELL (CD4) - (RCID CLINIC ONLY)
CD4 % Helper T Cell: 25 % — ABNORMAL LOW (ref 33–65)
CD4 T Cell Abs: 475 /uL (ref 400–1790)

## 2019-07-04 LAB — CBC WITH DIFFERENTIAL/PLATELET
Absolute Monocytes: 356 cells/uL (ref 200–950)
Basophils Absolute: 11 cells/uL (ref 0–200)
Basophils Relative: 0.3 %
Eosinophils Absolute: 90 cells/uL (ref 15–500)
Eosinophils Relative: 2.5 %
HCT: 36.1 % (ref 35.0–45.0)
Hemoglobin: 12.3 g/dL (ref 11.7–15.5)
Lymphs Abs: 1631 cells/uL (ref 850–3900)
MCH: 32.8 pg (ref 27.0–33.0)
MCHC: 34.1 g/dL (ref 32.0–36.0)
MCV: 96.3 fL (ref 80.0–100.0)
MPV: 10.4 fL (ref 7.5–12.5)
Monocytes Relative: 9.9 %
Neutro Abs: 1512 cells/uL (ref 1500–7800)
Neutrophils Relative %: 42 %
Platelets: 261 10*3/uL (ref 140–400)
RBC: 3.75 10*6/uL — ABNORMAL LOW (ref 3.80–5.10)
RDW: 11.8 % (ref 11.0–15.0)
Total Lymphocyte: 45.3 %
WBC: 3.6 10*3/uL — ABNORMAL LOW (ref 3.8–10.8)

## 2019-07-04 LAB — COMPLETE METABOLIC PANEL WITH GFR
AG Ratio: 1.4 (calc) (ref 1.0–2.5)
ALT: 14 U/L (ref 6–29)
AST: 20 U/L (ref 10–35)
Albumin: 4.4 g/dL (ref 3.6–5.1)
Alkaline phosphatase (APISO): 46 U/L (ref 37–153)
BUN/Creatinine Ratio: 14 (calc) (ref 6–22)
BUN: 15 mg/dL (ref 7–25)
CO2: 28 mmol/L (ref 20–32)
Calcium: 9.6 mg/dL (ref 8.6–10.4)
Chloride: 105 mmol/L (ref 98–110)
Creat: 1.07 mg/dL — ABNORMAL HIGH (ref 0.50–0.99)
GFR, Est African American: 64 mL/min/{1.73_m2} (ref >=60)
GFR, Est Non African American: 56 mL/min/{1.73_m2} — ABNORMAL LOW (ref >=60)
Globulin: 3.2 g/dL (calc) (ref 1.9–3.7)
Glucose, Bld: 86 mg/dL (ref 65–99)
Potassium: 3.9 mmol/L (ref 3.5–5.3)
Sodium: 141 mmol/L (ref 135–146)
Total Bilirubin: 0.4 mg/dL (ref 0.2–1.2)
Total Protein: 7.6 g/dL (ref 6.1–8.1)

## 2019-07-04 LAB — RPR: RPR Ser Ql: NONREACTIVE

## 2019-07-04 LAB — HIV-1 RNA QUANT-NO REFLEX-BLD
HIV 1 RNA Quant: <20 copies/mL
HIV-1 RNA Quant, Log: <1.3 Log copies/mL

## 2019-07-20 ENCOUNTER — Ambulatory Visit: Payer: Medicaid Other | Attending: Internal Medicine

## 2019-07-20 DIAGNOSIS — Z23 Encounter for immunization: Secondary | ICD-10-CM

## 2019-07-20 NOTE — Progress Notes (Signed)
   Covid-19 Vaccination Clinic  Name:  [name]    MRN: [id number] DOB: Jul 08, 1955  07/20/2019  [name] was observed post Covid-19 immunization for 30 minutes based on pre-vaccination screening without incident. She was provided with Vaccine Information Sheet and instruction to access the V-Safe system.   [name] was instructed to call 911 with any severe reactions post vaccine: Marland Kitchen Difficulty breathing  . Swelling of face and throat  . A fast heartbeat  . A bad rash all over body  . Dizziness and weakness   Immunizations Administered    Name Date Dose VIS Date Route   Pfizer COVID-19 Vaccine 07/20/2019  5:23 PM 0.3 mL 04/26/2019 Intramuscular   Manufacturer: [employer], [employer]   Lot: [id number]   NDC: [address]-1

## 2019-07-30 ENCOUNTER — Other Ambulatory Visit: Payer: Self-pay | Admitting: Internal Medicine

## 2019-07-30 DIAGNOSIS — K219 Gastro-esophageal reflux disease without esophagitis: Secondary | ICD-10-CM

## 2019-08-10 ENCOUNTER — Ambulatory Visit: Payer: Medicaid Other

## 2019-08-10 ENCOUNTER — Other Ambulatory Visit: Payer: Self-pay | Admitting: Internal Medicine

## 2019-08-13 ENCOUNTER — Ambulatory Visit: Payer: Medicaid Other | Attending: Internal Medicine

## 2019-08-13 DIAGNOSIS — Z23 Encounter for immunization: Secondary | ICD-10-CM

## 2019-08-13 NOTE — Progress Notes (Signed)
   Covid-19 Vaccination Clinic  Name:  Debra Freeman    MRN: [id number] DOB: May 29, 1955  08/13/2019  [name] was observed post Covid-19 immunization for 30 minutes based on pre-vaccination screening without incident. She was provided with Vaccine Information Sheet and instruction to access the V-Safe system.   [name] was instructed to call 911 with any severe reactions post vaccine: Marland Kitchen Difficulty breathing  . Swelling of face and throat  . A fast heartbeat  . A bad rash all over body  . Dizziness and weakness   Immunizations Administered    Name Date Dose VIS Date Route   Pfizer COVID-19 Vaccine 08/13/2019  8:14 AM 0.3 mL 04/26/2019 Intramuscular   Manufacturer: [employer], [employer]   Lot: [id number]   NDC: [address]-0

## 2019-08-20 ENCOUNTER — Ambulatory Visit: Payer: Medicaid Other

## 2019-09-18 ENCOUNTER — Other Ambulatory Visit: Payer: Self-pay

## 2019-09-18 DIAGNOSIS — K649 Unspecified hemorrhoids: Secondary | ICD-10-CM

## 2019-09-19 MED ORDER — HYDROCORTISONE ACETATE 25 MG RE SUPP: 25.0000 mg | Freq: Two times a day (BID) | RECTAL | 0 refills | Status: AC

## 2019-09-25 ENCOUNTER — Other Ambulatory Visit: Payer: Self-pay | Admitting: Internal Medicine

## 2019-09-25 DIAGNOSIS — K219 Gastro-esophageal reflux disease without esophagitis: Secondary | ICD-10-CM

## 2019-09-25 DIAGNOSIS — B20 Human immunodeficiency virus [HIV] disease: Secondary | ICD-10-CM

## 2019-09-26 ENCOUNTER — Other Ambulatory Visit: Payer: Self-pay

## 2019-09-26 DIAGNOSIS — G44029 Chronic cluster headache, not intractable: Secondary | ICD-10-CM

## 2019-09-26 MED ORDER — IBUPROFEN 800 MG PO TABS: 800.0000 mg | ORAL_TABLET | Freq: Three times a day (TID) | ORAL | 3 refills | Status: DC | PRN

## 2019-10-21 ENCOUNTER — Ambulatory Visit: Payer: Medicaid Other | Admitting: Infectious Diseases

## 2019-11-08 ENCOUNTER — Other Ambulatory Visit: Payer: Self-pay | Admitting: Internal Medicine

## 2019-11-08 DIAGNOSIS — G44029 Chronic cluster headache, not intractable: Secondary | ICD-10-CM

## 2019-11-11 ENCOUNTER — Other Ambulatory Visit: Payer: Self-pay

## 2019-11-23 ENCOUNTER — Other Ambulatory Visit: Payer: Self-pay | Admitting: Internal Medicine

## 2019-11-23 DIAGNOSIS — K219 Gastro-esophageal reflux disease without esophagitis: Secondary | ICD-10-CM

## 2019-11-25 ENCOUNTER — Other Ambulatory Visit: Payer: Self-pay | Admitting: Family Medicine

## 2019-11-25 DIAGNOSIS — Z1231 Encounter for screening mammogram for malignant neoplasm of breast: Secondary | ICD-10-CM

## 2019-12-03 ENCOUNTER — Other Ambulatory Visit: Payer: Self-pay

## 2019-12-03 ENCOUNTER — Ambulatory Visit
Admission: RE | Admit: 2019-12-03 | Discharge: 2019-12-03 | Disposition: A | Discharge status: Home / Self Care | Payer: Medicaid Other | Source: Ambulatory Visit | Attending: Family Medicine | Admitting: Family Medicine

## 2019-12-03 DIAGNOSIS — Z1231 Encounter for screening mammogram for malignant neoplasm of breast: Secondary | ICD-10-CM

## 2020-01-13 ENCOUNTER — Other Ambulatory Visit: Payer: Self-pay

## 2020-01-13 ENCOUNTER — Encounter: Payer: Self-pay | Admitting: Internal Medicine

## 2020-01-13 ENCOUNTER — Ambulatory Visit (INDEPENDENT_AMBULATORY_CARE_PROVIDER_SITE_OTHER): Payer: Medicaid Other | Admitting: Internal Medicine

## 2020-01-13 ENCOUNTER — Telehealth: Payer: Self-pay

## 2020-01-13 VITALS — BP 150/86 | HR 74 | Wt 143.0 lb

## 2020-01-13 DIAGNOSIS — Z79899 Other long term (current) drug therapy: Secondary | ICD-10-CM | POA: Diagnosis not present

## 2020-01-13 DIAGNOSIS — G44029 Chronic cluster headache, not intractable: Secondary | ICD-10-CM

## 2020-01-13 DIAGNOSIS — B20 Human immunodeficiency virus [HIV] disease: Secondary | ICD-10-CM | POA: Diagnosis not present

## 2020-01-13 MED ORDER — IBUPROFEN 800 MG PO TABS: 800.0000 mg | ORAL_TABLET | Freq: Three times a day (TID) | ORAL | 0 refills | Status: DC | PRN

## 2020-01-13 NOTE — Telephone Encounter (Signed)
Called patient to inform her that she doesn't need to wait 8 months for booster dose; patient qualifies for booster 4 weeks after last dose. Offered to reschedule appointment sooner then December. Patient will call back to reschedule if she's able to get off work sooner. RN apologized for the confusion.   Hobert Poplaski Loyola Mast, RN

## 2020-01-13 NOTE — Progress Notes (Signed)
RFV: follow-up for hiv disease  Patient ID: Debra Freeman, female   DOB: 1955/08/08, 64 y.o.   MRN: [id number]  HPI 64yo F with long standing hiv disease on descovy-RAL. Well controlled. Takes medications daily without missing dose. She reports noticing having headache and taking ibuprofen more frequently. Sometimes triggered by stress. No blurry vision, but has photo sensitivity. Sensitive to sound. No n/v  Outpatient Encounter Medications as of 01/13/2020  Medication Sig  . DESCOVY 200-25 MG tablet TAKE 1 TABLET BY MOUTH EVERY DAY  . ISENTRESS HD 600 MG TABS TAKE 2 TABLETS BY MOUTH EVERY DAY  . megestrol (MEGACE ES) 625 MG/5ML suspension Take 5 mLs (625 mg total) by mouth daily.  Marland Kitchen omeprazole (PRILOSEC) 40 MG capsule TAKE 1 CAPSULE BY MOUTH EVERY DAY  . ondansetron (ZOFRAN) 4 MG tablet TAKE 1 TABLET BY MOUTH 2 TIMES DAILY.  Marland Kitchen Prenatal 27-1 MG TABS TAKE 1 TABLET BY MOUTH DAILY AT NOON. SEPARATE FROM BIKTARVY BY 4 HOURS  . QUEtiapine (SEROQUEL) 25 MG tablet TAKE 1 TABLET BY MOUTH EVERYDAY AT BEDTIME  . valACYclovir (VALTREX) 1000 MG tablet TAKE 1 TABLET (1,000 MG TOTAL) BY MOUTH EVERY 8 (EIGHT) HOURS. IF NEEDED FOR OUTBREAK  . azithromycin (ZITHROMAX) 250 MG tablet Take 2 tabs by mouth today then 1 tab daily thereafter. (Patient not taking: Reported on 12/24/2018)  . clonazePAM (KLONOPIN) 0.5 MG tablet Take 1 tablet (0.5 mg total) by mouth 2 (two) times daily as needed for anxiety.  . gabapentin (NEURONTIN) 300 MG capsule Take 1 capsule (300 mg total) by mouth 3 (three) times daily. Start with taking 1 tab daily for 7 days followed by 2x/day for 7 days, then (Patient not taking: Reported on 06/25/2018)  . hydrocortisone (ANUSOL-HC) 25 MG suppository Place 1 suppository (25 mg total) rectally 2 (two) times daily. (Patient not taking: Reported on 01/13/2020)  . ibuprofen (ADVIL) 800 MG tablet TAKE 1 TABLET BY MOUTH EVERY 8 HOURS AS NEEDED (Patient not taking: Reported on 01/13/2020)  .  megestrol (MEGACE) 40 MG/ML suspension TAKE 10 MLS (400 MG TOTAL) BY MOUTH DAILY.   No facility-administered encounter medications on file as of 01/13/2020.     Patient Active Problem List   Diagnosis Date Noted  . URI (upper respiratory infection) 05/26/2018  . Primary insomnia 04/24/2018  . Nephrolithiasis 11/21/2017  . Osteoporosis 11/21/2017  . Abnormal anal Papanicolaou smear 11/21/2017  . Hx of hepatitis C 11/21/2017  . 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 Tachycardia 11/21/2017  . GERD (gastroesophageal reflux disease) 11/21/2017  . Menopausal disorder 11/21/2017  . History of difficult venous access 11/07/2017  . Human immunodeficiency virus (HIV) disease (HCC) 10/22/2017     Health Maintenance Due  Topic Date Due  . COLONOSCOPY  Never done  . INFLUENZA VACCINE  12/15/2019     Review of Systems Review of Systems  Constitutional: Negative for fever, chills, diaphoresis, activity change, appetite change, fatigue and unexpected weight change.  HENT: Negative for congestion, sore throat, rhinorrhea, sneezing, trouble swallowing and sinus pressure.  Eyes: Negative for photophobia and visual disturbance.  Respiratory: Negative for cough, chest tightness, shortness of breath, wheezing and stridor.  Cardiovascular: Negative for chest pain, palpitations and leg swelling.  Gastrointestinal: Negative for nausea, vomiting, abdominal pain, diarrhea, constipation, blood in stool, abdominal distention and anal bleeding.  Genitourinary: Negative for dysuria, hematuria, flank pain and difficulty urinating.  Musculoskeletal: Negative for myalgias, back pain, joint swelling, arthralgias and gait problem.  Skin: Negative for color  change, pallor, rash and wound.  Neurological: headache + Hematological: Negative for adenopathy. Does not bruise/bleed easily.  Psychiatric/Behavioral: Negative for behavioral problems, confusion, sleep disturbance, dysphoric mood, decreased  concentration and agitation.    Physical Exam   BP (!) 150/86   Pulse 74   Wt 143 lb (64.9 kg)   BMI 24.93 kg/m   Physical Exam  Constitutional:  oriented to person, place, and time. appears well-developed and well-nourished. No distress.  HENT: [place]/AT, PERRLA, no scleral icterus Mouth/Throat: Oropharynx is clear and moist. No oropharyngeal exudate.  Cardiovascular: Normal rate, regular rhythm and normal heart sounds. Exam reveals no gallop and no friction rub.  No murmur heard.  Pulmonary/Chest: Effort normal and breath sounds normal. No respiratory distress.  has no wheezes.  Neck = supple, no nuchal rigidity Abdominal: Soft. Bowel sounds are normal.  exhibits no distension. There is no tenderness.  Lymphadenopathy: no cervical adenopathy. No axillary adenopathy Neurological: alert and oriented to person, place, and time.  Skin: Skin is warm and dry. No rash noted. No erythema.  Psychiatric: a normal mood and affect.  behavior is normal.   Lab Results  Component Value Date   CD4TCELL 25 (L) 07/01/2019   Lab Results  Component Value Date   CD4TABS 475 07/01/2019   CD4TABS 612 02/11/2019   CD4TABS 373 (L) 12/25/2018   Lab Results  Component Value Date   HIV1RNAQUANT <20 NOT DETECTED 07/01/2019   No results found for: HEPBSAB Lab Results  Component Value Date   LABRPR NON-REACTIVE 07/01/2019    CBC Lab Results  Component Value Date   WBC 3.6 (L) 07/01/2019   RBC 3.75 (L) 07/01/2019   HGB 12.3 07/01/2019   HCT 36.1 07/01/2019   PLT 261 07/01/2019   MCV 96.3 07/01/2019   MCH 32.8 07/01/2019   MCHC 34.1 07/01/2019   RDW 11.8 07/01/2019   LYMPHSABS 1,631 07/01/2019   EOSABS 90 07/01/2019    BMET Lab Results  Component Value Date   NA 141 07/01/2019   K 3.9 07/01/2019   CL 105 07/01/2019   CO2 28 07/01/2019   GLUCOSE 86 07/01/2019   BUN 15 07/01/2019   CREATININE 1.07 (H) 07/01/2019   CALCIUM 9.6 07/01/2019   GFRNONAA 56 (L) 07/01/2019   GFRAA 64  07/01/2019     Assessment and Plan  hiv disease= check labs at next visit, likely well controlled  Long term med management = cr is stable  Chronic headaches = improved. Refilled ibuprofen 800mg  use sparingly. Previously had rebound ha from nsaid use.  Health maintenance =  Need to check cholesterol to see if needs modification. Needs flu in the Fall  Getting 3rd dose of pfizer covid today  mammo completed in July. Has next pap in feb

## 2020-01-27 ENCOUNTER — Other Ambulatory Visit: Payer: Self-pay | Admitting: Internal Medicine

## 2020-01-27 DIAGNOSIS — M810 Age-related osteoporosis without current pathological fracture: Secondary | ICD-10-CM

## 2020-01-27 DIAGNOSIS — G44029 Chronic cluster headache, not intractable: Secondary | ICD-10-CM

## 2020-01-27 DIAGNOSIS — B20 Human immunodeficiency virus [HIV] disease: Secondary | ICD-10-CM

## 2020-02-05 ENCOUNTER — Other Ambulatory Visit: Payer: Self-pay | Admitting: Internal Medicine

## 2020-02-05 DIAGNOSIS — B20 Human immunodeficiency virus [HIV] disease: Secondary | ICD-10-CM

## 2020-02-05 DIAGNOSIS — G44029 Chronic cluster headache, not intractable: Secondary | ICD-10-CM

## 2020-02-05 DIAGNOSIS — M810 Age-related osteoporosis without current pathological fracture: Secondary | ICD-10-CM

## 2020-02-21 ENCOUNTER — Ambulatory Visit: Payer: Medicaid Other

## 2020-03-02 ENCOUNTER — Other Ambulatory Visit: Payer: Self-pay | Admitting: Internal Medicine

## 2020-03-02 DIAGNOSIS — B20 Human immunodeficiency virus [HIV] disease: Secondary | ICD-10-CM

## 2020-03-02 DIAGNOSIS — M810 Age-related osteoporosis without current pathological fracture: Secondary | ICD-10-CM

## 2020-03-06 ENCOUNTER — Other Ambulatory Visit: Payer: Self-pay | Admitting: Internal Medicine

## 2020-03-09 ENCOUNTER — Telehealth: Payer: Self-pay

## 2020-03-09 NOTE — Telephone Encounter (Signed)
Incoming Prior Auth request via fax from Pharmacy. Request submitted via Cover My Meds. Status update: Approved. Notified Pharmacy via fax. Valarie Cones

## 2020-03-20 ENCOUNTER — Other Ambulatory Visit: Payer: Self-pay | Admitting: Internal Medicine

## 2020-03-20 DIAGNOSIS — B20 Human immunodeficiency virus [HIV] disease: Secondary | ICD-10-CM

## 2020-03-20 DIAGNOSIS — K219 Gastro-esophageal reflux disease without esophagitis: Secondary | ICD-10-CM

## 2020-03-31 ENCOUNTER — Other Ambulatory Visit: Payer: Self-pay | Admitting: Internal Medicine

## 2020-03-31 DIAGNOSIS — K219 Gastro-esophageal reflux disease without esophagitis: Secondary | ICD-10-CM

## 2020-03-31 DIAGNOSIS — B20 Human immunodeficiency virus [HIV] disease: Secondary | ICD-10-CM

## 2020-04-20 ENCOUNTER — Ambulatory Visit: Payer: Medicaid Other | Admitting: Internal Medicine

## 2020-04-20 ENCOUNTER — Ambulatory Visit: Payer: Medicaid Other

## 2020-04-21 ENCOUNTER — Ambulatory Visit: Payer: Medicaid Other | Admitting: Internal Medicine

## 2020-04-21 ENCOUNTER — Other Ambulatory Visit: Payer: Self-pay

## 2020-04-27 ENCOUNTER — Ambulatory Visit: Payer: Medicaid Other | Admitting: Internal Medicine

## 2020-05-01 ENCOUNTER — Other Ambulatory Visit: Payer: Self-pay | Admitting: Internal Medicine

## 2020-05-01 DIAGNOSIS — K219 Gastro-esophageal reflux disease without esophagitis: Secondary | ICD-10-CM

## 2020-05-01 DIAGNOSIS — B20 Human immunodeficiency virus [HIV] disease: Secondary | ICD-10-CM

## 2020-05-04 ENCOUNTER — Other Ambulatory Visit: Payer: Self-pay | Admitting: Internal Medicine

## 2020-05-04 DIAGNOSIS — B20 Human immunodeficiency virus [HIV] disease: Secondary | ICD-10-CM

## 2020-05-13 ENCOUNTER — Other Ambulatory Visit: Payer: Self-pay | Admitting: Internal Medicine

## 2020-05-13 DIAGNOSIS — B20 Human immunodeficiency virus [HIV] disease: Secondary | ICD-10-CM

## 2020-05-13 DIAGNOSIS — K219 Gastro-esophageal reflux disease without esophagitis: Secondary | ICD-10-CM

## 2020-05-28 ENCOUNTER — Other Ambulatory Visit: Payer: Self-pay

## 2020-05-28 ENCOUNTER — Ambulatory Visit (INDEPENDENT_AMBULATORY_CARE_PROVIDER_SITE_OTHER): Payer: Medicaid Other | Admitting: Internal Medicine

## 2020-05-28 ENCOUNTER — Encounter: Payer: Self-pay | Admitting: Internal Medicine

## 2020-05-28 VITALS — BP 125/85 | HR 87 | Temp 99.0°F | Ht 62.0 in | Wt 136.0 lb

## 2020-05-28 DIAGNOSIS — B20 Human immunodeficiency virus [HIV] disease: Secondary | ICD-10-CM | POA: Diagnosis present

## 2020-05-28 DIAGNOSIS — Z113 Encounter for screening for infections with a predominantly sexual mode of transmission: Secondary | ICD-10-CM | POA: Diagnosis not present

## 2020-05-28 DIAGNOSIS — Z79899 Other long term (current) drug therapy: Secondary | ICD-10-CM

## 2020-05-28 DIAGNOSIS — R634 Abnormal weight loss: Secondary | ICD-10-CM

## 2020-05-28 NOTE — Progress Notes (Signed)
RFV: follow up for hiv disease  Patient ID: Debra Freeman, female   DOB: 07-19-1955, 65 y.o.   MRN: [id number]  HPI Stanton Kidney is a 65yo F with long standing HIV disease. She reports that she has been doing well overall. Continues to take raltegravir and descovy daily. Not missing doses. Has not had covid and has been fully boosted.   ROS: she has occ nightsweats she attributes to menopause. She has alittle bit of decrease weight due to decrease intake. Less cooking. Otherwise 12 point ros is negative.  Outpatient Encounter Medications as of 05/28/2020  Medication Sig  . DESCOVY 200-25 MG tablet TAKE 1 TABLET BY MOUTH EVERY DAY  . hydrocortisone (ANUSOL-HC) 25 MG suppository Place 1 suppository (25 mg total) rectally 2 (two) times daily.  Marland Kitchen ibuprofen (ADVIL) 800 MG tablet TAKE 1 TABLET BY MOUTH EVERY 8 HOURS AS NEEDED  . ISENTRESS HD 600 MG TABS TAKE 2 TABLETS BY MOUTH EVERY DAY  . megestrol (MEGACE) 40 MG/ML suspension TAKE 10 MLS (400 MG TOTAL) BY MOUTH DAILY.  Marland Kitchen omeprazole (PRILOSEC) 40 MG capsule TAKE 1 CAPSULE BY MOUTH EVERY DAY  . ondansetron (ZOFRAN) 4 MG tablet TAKE 1 TABLET BY MOUTH 2 TIMES DAILY.  Marland Kitchen Prenatal 27-1 MG TABS TAKE 1 TABLET BY MOUTH DAILY AT NOON. SEPARATE FROM BIKTARVY BY 4 HOURS  . Prenatal Vit-Fe Fumarate-FA (PRENATAL VITAMIN PLUS LOW IRON) 27-1 MG TABS Take 1 tablet by mouth daily.  . QUEtiapine (SEROQUEL) 25 MG tablet TAKE 1 TABLET BY MOUTH EVERYDAY AT BEDTIME  . valACYclovir (VALTREX) 1000 MG tablet TAKE 1 TABLET (1,000 MG TOTAL) BY MOUTH EVERY 8 (EIGHT) HOURS. IF NEEDED FOR OUTBREAK  . [DISCONTINUED] azithromycin (ZITHROMAX) 250 MG tablet Take 2 tabs by mouth today then 1 tab daily thereafter. (Patient not taking: Reported on 12/24/2018)  . [DISCONTINUED] clonazePAM (KLONOPIN) 0.5 MG tablet Take 1 tablet (0.5 mg total) by mouth 2 (two) times daily as needed for anxiety.  . [DISCONTINUED] gabapentin (NEURONTIN) 300 MG capsule Take 1 capsule (300 mg total) by  mouth 3 (three) times daily. Start with taking 1 tab daily for 7 days followed by 2x/day for 7 days, then (Patient not taking: Reported on 06/25/2018)  . [DISCONTINUED] megestrol (MEGACE ES) 625 MG/5ML suspension Take 5 mLs (625 mg total) by mouth daily.   No facility-administered encounter medications on file as of 05/28/2020.     Patient Active Problem List   Diagnosis Date Noted  . URI (upper respiratory infection) 05/26/2018  . Primary insomnia 04/24/2018  . Nephrolithiasis 11/21/2017  . Osteoporosis 11/21/2017  . Abnormal anal Papanicolaou smear 11/21/2017  . Hx of hepatitis C 11/21/2017  . 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 Tachycardia 11/21/2017  . GERD (gastroesophageal reflux disease) 11/21/2017  . Menopausal disorder 11/21/2017  . History of difficult venous access 11/07/2017  . Human immunodeficiency virus (HIV) disease (HCC) 10/22/2017     Health Maintenance Due  Topic Date Due  . COLONOSCOPY (Pts 45-65yrs Insurance coverage will need to be confirmed)  Never done  . INFLUENZA VACCINE  Never done  . PAP SMEAR-Modifier  06/16/2020     Physical Exam   BP 125/85   Pulse 87   Temp 99 F (37.2 C) (Oral)   Ht 5\' 2"  (1.575 m)   Wt 136 lb (61.7 kg)   SpO2 99%   BMI 24.87 kg/m   Physical Exam  Constitutional:  oriented to person, place, and time. appears well-developed and well-nourished. No distress.  HENT: [place]/AT, PERRLA, no scleral icterus Mouth/Throat: Oropharynx is clear and moist. No oropharyngeal exudate.  Cardiovascular: Normal rate, regular rhythm and normal heart sounds. Exam reveals no gallop and no friction rub.  No murmur heard.  Pulmonary/Chest: Effort normal and breath sounds normal. No respiratory distress.  has no wheezes.  Neck = supple, no nuchal rigidity Lymphadenopathy: no cervical adenopathy.  Neurological: alert and oriented to person, place, and time.  Skin: Skin is warm and dry. No rash noted. No erythema.  Psychiatric: a  normal mood and affect.  behavior is normal.   Lab Results  Component Value Date   CD4TCELL 25 (L) 07/01/2019   Lab Results  Component Value Date   CD4TABS 475 07/01/2019   CD4TABS 612 02/11/2019   CD4TABS 373 (L) 12/25/2018   Lab Results  Component Value Date   HIV1RNAQUANT <20 NOT DETECTED 07/01/2019   No results found for: HEPBSAB Lab Results  Component Value Date   LABRPR NON-REACTIVE 07/01/2019    CBC Lab Results  Component Value Date   WBC 3.6 (L) 07/01/2019   RBC 3.75 (L) 07/01/2019   HGB 12.3 07/01/2019   HCT 36.1 07/01/2019   PLT 261 07/01/2019   MCV 96.3 07/01/2019   MCH 32.8 07/01/2019   MCHC 34.1 07/01/2019   RDW 11.8 07/01/2019   LYMPHSABS 1,631 07/01/2019   EOSABS 90 07/01/2019    BMET Lab Results  Component Value Date   NA 141 07/01/2019   K 3.9 07/01/2019   CL 105 07/01/2019   CO2 28 07/01/2019   GLUCOSE 86 07/01/2019   BUN 15 07/01/2019   CREATININE 1.07 (H) 07/01/2019   CALCIUM 9.6 07/01/2019   GFRNONAA 56 (L) 07/01/2019   GFRAA 64 07/01/2019      Assessment and Plan  HIV disease= will do annual labs. Suspect still well controlled, given good adherence  Weight loss = our records show only 2 lb weight loss since last visit. Will continue to monitor.   Health maintenance = she is up to date on health maintenance uptodate on covid vaccines, deferred getting flu vaccine in the past.

## 2020-05-29 LAB — T-HELPER CELL (CD4) - (RCID CLINIC ONLY)
CD4 % Helper T Cell: 26 % — ABNORMAL LOW (ref 33–65)
CD4 T Cell Abs: 541 /uL (ref 400–1790)

## 2020-06-01 ENCOUNTER — Other Ambulatory Visit: Payer: Self-pay | Admitting: Internal Medicine

## 2020-06-01 DIAGNOSIS — K219 Gastro-esophageal reflux disease without esophagitis: Secondary | ICD-10-CM

## 2020-06-01 DIAGNOSIS — B20 Human immunodeficiency virus [HIV] disease: Secondary | ICD-10-CM

## 2020-06-02 LAB — CBC WITH DIFFERENTIAL/PLATELET
Absolute Monocytes: 456 cells/uL (ref 200–950)
Basophils Absolute: 19 cells/uL (ref 0–200)
Basophils Relative: 0.4 %
Eosinophils Absolute: 48 cells/uL (ref 15–500)
Eosinophils Relative: 1 %
HCT: 37.2 % (ref 35.0–45.0)
Hemoglobin: 12.8 g/dL (ref 11.7–15.5)
Lymphs Abs: 2002 cells/uL (ref 850–3900)
MCH: 33.1 pg — ABNORMAL HIGH (ref 27.0–33.0)
MCHC: 34.4 g/dL (ref 32.0–36.0)
MCV: 96.1 fL (ref 80.0–100.0)
MPV: 10.9 fL (ref 7.5–12.5)
Monocytes Relative: 9.5 %
Neutro Abs: 2275 cells/uL (ref 1500–7800)
Neutrophils Relative %: 47.4 %
Platelets: 278 10*3/uL (ref 140–400)
RBC: 3.87 10*6/uL (ref 3.80–5.10)
RDW: 13 % (ref 11.0–15.0)
Total Lymphocyte: 41.7 %
WBC: 4.8 10*3/uL (ref 3.8–10.8)

## 2020-06-02 LAB — COMPLETE METABOLIC PANEL WITH GFR
AG Ratio: 1.3 (calc) (ref 1.0–2.5)
ALT: 18 U/L (ref 6–29)
AST: 22 U/L (ref 10–35)
Albumin: 4.3 g/dL (ref 3.6–5.1)
Alkaline phosphatase (APISO): 45 U/L (ref 37–153)
BUN/Creatinine Ratio: 17 (calc) (ref 6–22)
BUN: 19 mg/dL (ref 7–25)
CO2: 24 mmol/L (ref 20–32)
Calcium: 9.5 mg/dL (ref 8.6–10.4)
Chloride: 106 mmol/L (ref 98–110)
Creat: 1.09 mg/dL — ABNORMAL HIGH (ref 0.50–0.99)
GFR, Est African American: 63 mL/min/{1.73_m2} (ref >=60)
GFR, Est Non African American: 54 mL/min/{1.73_m2} — ABNORMAL LOW (ref >=60)
Globulin: 3.4 g/dL (calc) (ref 1.9–3.7)
Glucose, Bld: 89 mg/dL (ref 65–99)
Potassium: 3.6 mmol/L (ref 3.5–5.3)
Sodium: 137 mmol/L (ref 135–146)
Total Bilirubin: 0.3 mg/dL (ref 0.2–1.2)
Total Protein: 7.7 g/dL (ref 6.1–8.1)

## 2020-06-02 LAB — RPR: RPR Ser Ql: NONREACTIVE

## 2020-06-02 LAB — LIPID PANEL
Cholesterol: 145 mg/dL (ref <200)
HDL: 57 mg/dL (ref >=50)
LDL Cholesterol (Calc): 75 mg/dL (calc)
Non-HDL Cholesterol (Calc): 88 mg/dL (calc) (ref <130)
Total CHOL/HDL Ratio: 2.5 (calc) (ref <5.0)
Triglycerides: 57 mg/dL (ref <150)

## 2020-06-02 LAB — HIV-1 RNA QUANT-NO REFLEX-BLD
HIV 1 RNA Quant: <20 Copies/mL
HIV-1 RNA Quant, Log: <1.3 Log cps/mL

## 2020-06-14 ENCOUNTER — Other Ambulatory Visit: Payer: Self-pay | Admitting: Internal Medicine

## 2020-06-14 DIAGNOSIS — K219 Gastro-esophageal reflux disease without esophagitis: Secondary | ICD-10-CM

## 2020-06-14 DIAGNOSIS — M810 Age-related osteoporosis without current pathological fracture: Secondary | ICD-10-CM

## 2020-06-14 DIAGNOSIS — B20 Human immunodeficiency virus [HIV] disease: Secondary | ICD-10-CM

## 2020-06-20 ENCOUNTER — Other Ambulatory Visit: Payer: Self-pay | Admitting: Internal Medicine

## 2020-07-29 ENCOUNTER — Ambulatory Visit: Payer: Medicaid Other | Admitting: Physical Therapy

## 2020-09-01 ENCOUNTER — Other Ambulatory Visit: Payer: Self-pay

## 2020-09-01 ENCOUNTER — Ambulatory Visit: Payer: Medicaid Other | Admitting: Dermatology

## 2020-09-01 DIAGNOSIS — L811 Chloasma: Secondary | ICD-10-CM

## 2020-09-01 DIAGNOSIS — L821 Other seborrheic keratosis: Secondary | ICD-10-CM

## 2020-09-01 NOTE — Progress Notes (Signed)
   New Patient Visit  Subjective  Debra Freeman is a 65 y.o. female who presents for the following: dark spots (On the face for many years - was rx'ed a "bleaching cream" in the past by a dermatologist, but patient was afraid she would then have light spots on her face).  The following portions of the chart were reviewed this encounter and updated as appropriate:   Tobacco  Allergies  Meds  Problems  Med Hx  Surg Hx  Fam Hx     Review of Systems:  No other skin or systemic complaints except as noted in HPI or Assessment and Plan.  Objective  Well appearing patient in no apparent distress; mood and affect are within normal limits.  A focused examination was performed including the face. Relevant physical exam findings are noted in the Assessment and Plan.  Objective  Face: Reticulated hyperpigmented patches.   Images          Assessment & Plan  Melasma Face Melasma is a condition of persistent pigmented patches generally on the face, worse in summer due to higher UV exposure.  Oral estrogen containing BCPs or supplements can exacerbate condition.  Recommend daily broad spectrum tinted sunscreen SPF 30+ to face, preferably with Zinc or Titanium Dioxide. Discussed Rx topical bleaching creams (i.e. hydroquinone), OTC HelioCare supplement, chemical peels (would need multiple for best result).   Start Skin Medicinals Hydroquinone mix QD x 3 mths. Do not use more than 3 months. Recommend sun screen/sun protection daily.   Seborrheic Keratoses - Stuck-on, waxy, tan-brown papules and/or plaques  - Benign-appearing - Discussed benign etiology and prognosis. - Observe - Call for any changes  Return in about 3 months (around 12/01/2020) for melasma follow up .  Maylene Roes, CMA, am acting as scribe for Armida Sans, MD .  Documentation: I have reviewed the above documentation for accuracy and completeness, and I agree with the above.  Armida Sans,  MD

## 2020-09-01 NOTE — Patient Instructions (Addendum)
If you have any questions or concerns for your doctor, please call our main line at [phone] and press option 4 to reach your doctor's medical assistant. If no one answers, please leave a voicemail as directed and we will return your call as soon as possible. Messages left after 4 pm will be answered the following business day.   You may also send us a message via MyChart. We typically respond to MyChart messages within 1-2 business days.  For prescription refills, please ask your pharmacy to contact our office. Our fax number is [phone].  If you have an urgent issue when the clinic is closed that cannot wait until the next business day, you can page your doctor at the number below.    Please note that while we do our best to be available for urgent issues outside of office hours, we are not available 24/7.   If you have an urgent issue and are unable to reach us, you may choose to seek medical care at your doctor's office, retail clinic, urgent care center, or emergency room.  If you have a medical emergency, please immediately call 911 or go to the emergency department.  Pager Numbers  - Dr. Kowalski: [phone]  - Dr. Moye: [phone]  - Dr. Stewart: [phone]  In the event of inclement weather, please call our main line at [phone] for an update on the status of any delays or closures.  Dermatology Medication Tips: Please keep the boxes that topical medications come in in order to help keep track of the instructions about where and how to use these. Pharmacies typically print the medication instructions only on the boxes and not directly on the medication tubes.   If your medication is too expensive, please contact our office at [phone] option 4 or send us a message through MyChart.   We are unable to tell what your co-pay for medications will be in advance as this is different depending on your insurance coverage. However, we may be able to find a substitute  medication at lower cost or fill out paperwork to get insurance to cover a needed medication.   If a prior authorization is required to get your medication covered by your insurance company, please allow us 1-2 business days to complete this process.  Drug prices often vary depending on where the prescription is filled and some pharmacies may offer cheaper prices.  The website www.goodrx.com contains coupons for medications through different pharmacies. The prices here do not account for what the cost may be with help from insurance (it may be cheaper with your insurance), but the website can give you the price if you did not use any insurance.  - You can print the associated coupon and take it with your prescription to the pharmacy.  - You may also stop by our office during regular business hours and pick up a GoodRx coupon card.  - If you need your prescription sent electronically to a different pharmacy, notify our office through [facility] MyChart or by phone at [phone] option 4.  Recommend taking Heliocare sun protection supplement daily in sunny weather for additional sun protection. For maximum protection on the sunniest days, you can take up to 2 capsules of regular Heliocare OR take 1 capsule of Heliocare Ultra. For prolonged exposure (such as a full day in the sun), you can repeat your dose of the supplement 4 hours after your first dose. Heliocare can be purchased at [facility] or at [email or web address].    

## 2020-09-02 ENCOUNTER — Encounter: Payer: Self-pay | Admitting: Dermatology

## 2020-09-13 ENCOUNTER — Other Ambulatory Visit: Payer: Self-pay | Admitting: Internal Medicine

## 2020-09-13 DIAGNOSIS — K219 Gastro-esophageal reflux disease without esophagitis: Secondary | ICD-10-CM

## 2020-09-13 DIAGNOSIS — B20 Human immunodeficiency virus [HIV] disease: Secondary | ICD-10-CM

## 2020-09-13 DIAGNOSIS — M810 Age-related osteoporosis without current pathological fracture: Secondary | ICD-10-CM

## 2020-10-11 ENCOUNTER — Other Ambulatory Visit: Payer: Self-pay | Admitting: Internal Medicine

## 2020-10-11 DIAGNOSIS — K219 Gastro-esophageal reflux disease without esophagitis: Secondary | ICD-10-CM

## 2020-10-11 DIAGNOSIS — B20 Human immunodeficiency virus [HIV] disease: Secondary | ICD-10-CM

## 2020-10-11 DIAGNOSIS — M810 Age-related osteoporosis without current pathological fracture: Secondary | ICD-10-CM

## 2020-10-19 ENCOUNTER — Other Ambulatory Visit: Payer: Self-pay | Admitting: Family Medicine

## 2020-10-19 DIAGNOSIS — Z1231 Encounter for screening mammogram for malignant neoplasm of breast: Secondary | ICD-10-CM

## 2020-12-01 ENCOUNTER — Other Ambulatory Visit: Payer: Self-pay

## 2020-12-01 ENCOUNTER — Other Ambulatory Visit: Payer: Medicaid Other

## 2020-12-01 DIAGNOSIS — B20 Human immunodeficiency virus [HIV] disease: Secondary | ICD-10-CM

## 2020-12-01 DIAGNOSIS — Z113 Encounter for screening for infections with a predominantly sexual mode of transmission: Secondary | ICD-10-CM

## 2020-12-02 ENCOUNTER — Ambulatory Visit (INDEPENDENT_AMBULATORY_CARE_PROVIDER_SITE_OTHER): Payer: Medicaid Other | Admitting: Dermatology

## 2020-12-02 ENCOUNTER — Other Ambulatory Visit: Payer: Self-pay

## 2020-12-02 DIAGNOSIS — L811 Chloasma: Secondary | ICD-10-CM | POA: Diagnosis not present

## 2020-12-02 NOTE — Patient Instructions (Signed)

## 2020-12-02 NOTE — Progress Notes (Signed)
   Follow-Up Visit   Subjective  Debra Freeman is a 65 y.o. female who presents for the following: melasma (Patient currently using Skin Medicinals  Hydroquinone: 12% Kojic Acid: 6% Niacinamide: 2% Vitamin C: 1% Vehicle: Cream QHS and has notice some improvement. ).  The following portions of the chart were reviewed this encounter and updated as appropriate:   Tobacco  Allergies  Meds  Problems  Med Hx  Surg Hx  Fam Hx     Review of Systems:  No other skin or systemic complaints except as noted in HPI or Assessment and Plan.  Objective  Well appearing patient in no apparent distress; mood and affect are within normal limits.  A focused examination was performed including the face. Relevant physical exam findings are noted in the Assessment and Plan.  Face Reticulated hyperpigmented patches.          Assessment & Plan  Melasma -improving on current regimen. Persistent and not to goal. Face  Melasma is a chronic condition of persistent pigmented patches generally on the face, worse in summer due to higher UV exposure.  Oral estrogen containing BCPs or supplements can exacerbate condition.  Recommend daily broad spectrum tinted sunscreen SPF 30+ to face, preferably with Zinc or Titanium Dioxide. Discussed Rx topical bleaching creams (i.e. hydroquinone), OTC HelioCare supplement, chemical peels (would need multiple for best result).   D/C mix for one month then restart.  Then in October start Hydroquinone mix with Tretinoin QHS for no more  than three months.  Hydroquinone: 8% Tretinoin: 0.025% Kojic Acid: 1% Niacinamide: 4% Fluocinolone: 0.025% Vehicle: Cream  Continue sunscreen daily. Continue Heliocare pills as needed.  Return in about 6 months (around 06/04/2021) for melasma follow up .  Maylene Roes, CMA, am acting as scribe for Armida Sans, MD . Documentation: I have reviewed the above documentation for accuracy and completeness, and I agree with the  above.  Armida Sans, MD

## 2020-12-03 ENCOUNTER — Encounter: Payer: Self-pay | Admitting: Dermatology

## 2020-12-07 ENCOUNTER — Other Ambulatory Visit: Payer: Self-pay

## 2020-12-07 ENCOUNTER — Ambulatory Visit (INDEPENDENT_AMBULATORY_CARE_PROVIDER_SITE_OTHER): Payer: Medicaid Other | Admitting: Internal Medicine

## 2020-12-07 ENCOUNTER — Encounter: Payer: Self-pay | Admitting: Internal Medicine

## 2020-12-07 VITALS — BP 138/84 | HR 76 | Temp 97.7°F | Wt 134.0 lb

## 2020-12-07 DIAGNOSIS — G609 Hereditary and idiopathic neuropathy, unspecified: Secondary | ICD-10-CM | POA: Diagnosis not present

## 2020-12-07 DIAGNOSIS — L232 Allergic contact dermatitis due to cosmetics: Secondary | ICD-10-CM | POA: Diagnosis not present

## 2020-12-07 DIAGNOSIS — B20 Human immunodeficiency virus [HIV] disease: Secondary | ICD-10-CM | POA: Diagnosis present

## 2020-12-07 MED ORDER — DOVATO 50-300 MG PO TABS: 1.0000 | ORAL_TABLET | Freq: Every day | ORAL | 11 refills | Status: DC

## 2020-12-07 NOTE — Progress Notes (Signed)
RFV: follow up for HIV disease  Patient ID: Debra Freeman, female   DOB: [birth year], 65 y.o.   MRN: [id number]  HPI 65yo F with descovy/RAL, HIV disease, CD 4 count of 541/VL<20/has had 3 doses of covid vaccine; she also has hx of neuropathy diagnosis in the past 15 yrs - but was not to this degree. In this past 6 months it has been excrutiating with burning, she is concern that this is due to descovy (though she has previously been on truvada)  ROS:  She is waking up in the morning "dizzy, feeling week" sometimes has nausea  Lip burning - possible dermatitis from bare minerals lip gloss  Outpatient Encounter Medications as of 12/07/2020  Medication Sig   DESCOVY 200-25 MG tablet TAKE 1 TABLET BY MOUTH EVERY DAY   hydrocortisone (ANUSOL-HC) 25 MG suppository Place 1 suppository (25 mg total) rectally 2 (two) times daily.   ibuprofen (ADVIL) 800 MG tablet TAKE 1 TABLET BY MOUTH EVERY 8 HOURS AS NEEDED   ISENTRESS HD 600 MG TABS TAKE 2 TABLETS BY MOUTH EVERY DAY   megestrol (MEGACE) 40 MG/ML suspension TAKE 10 MLS (400 MG TOTAL) BY MOUTH DAILY.   omeprazole (PRILOSEC) 40 MG capsule TAKE 1 CAPSULE BY MOUTH EVERY DAY   ondansetron (ZOFRAN) 4 MG tablet TAKE 1 TABLET BY MOUTH TWICE A DAY   Prenatal Vit-Fe Fumarate-FA (M-NATAL PLUS) 27-1 MG TABS TAKE 1 TABLET BY MOUTH DAILY AT NOON. SEPARATE FROM BIKTARVY BY 4 HOURS   Prenatal Vit-Fe Fumarate-FA (PRENATAL VITAMIN PLUS LOW IRON) 27-1 MG TABS Take 1 tablet by mouth daily.   QUEtiapine (SEROQUEL) 25 MG tablet TAKE 1 TABLET BY MOUTH EVERYDAY AT BEDTIME   valACYclovir (VALTREX) 1000 MG tablet TAKE 1 TABLET (1,000 MG TOTAL) BY MOUTH EVERY 8 (EIGHT) HOURS. IF NEEDED FOR OUTBREAK   No facility-administered encounter medications on file as of 12/07/2020.     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 Physical Exam   BP 138/84   Pulse 76   Temp 97.7 F (36.5 C) (Oral)   Wt 134 lb (60.8 kg)   BMI 24.51 kg/m  Physical Exam  Constitutional:  oriented to person, place, and time. appears well-developed and well-nourished. No distress.  HENT: [place]/AT, PERRLA, no scleral icterus Mouth/Throat: Oropharynx is clear and moist. No oropharyngeal exudate. Flakiness on upper lip Cardiovascular: Normal rate, regular rhythm and normal heart sounds. Exam reveals no gallop and no friction rub.  No murmur heard.  Pulmonary/Chest: Effort normal and breath sounds normal. No respiratory distress.  has no wheezes.  Neck = supple, no nuchal rigidity Abdominal: Soft. Bowel sounds are normal.  exhibits no distension. There is no tenderness.  Lymphadenopathy: no cervical adenopathy. No axillary adenopathy Neurological: alert and oriented to person, place, and time.  Skin: Skin is warm and dry. No rash noted. No erythema.  Psychiatric: a normal mood and affect.  behavior is normal.   Lab Results  Component Value Date   CD4TCELL 26 (L) 05/28/2020   Lab Results  Component Value Date   CD4TABS 541 05/28/2020   CD4TABS 475 07/01/2019   CD4TABS 612 02/11/2019   Lab Results  Component Value Date   HIV1RNAQUANT <20  05/28/2020   No results found for: HEPBSAB Lab Results  Component Value Date   LABRPR NON-REACTIVE 05/28/2020    CBC Lab Results  Component Value Date   WBC 4.8 05/28/2020   RBC 3.87 05/28/2020   HGB 12.8 05/28/2020   HCT 37.2 05/28/2020   PLT 278 05/28/2020   MCV 96.1 05/28/2020   MCH 33.1 (H) 05/28/2020   MCHC 34.4 05/28/2020   RDW 13.0 05/28/2020   LYMPHSABS 2,002 05/28/2020   EOSABS 48 05/28/2020    BMET Lab Results  Component Value Date   NA 137 05/28/2020   K 3.6 05/28/2020   CL 106 05/28/2020   CO2 24 05/28/2020   GLUCOSE 89 05/28/2020   BUN 19 05/28/2020   CREATININE 1.09 (H) 05/28/2020   CALCIUM 9.5 05/28/2020   GFRNONAA 54 (L) 05/28/2020   GFRAA 63 05/28/2020      Assessment and Plan  Peripheral neuropathy = likely due to hiv disease. However will rule out other causes. Can also try to use gabapentin at bedtime to see if alleviates any discomfort. Start with 300mg  at bedtime. Blood work for anemia and peripheral neuropathy check for vitamin deficiency as cause of neuropathy  HIV disease= Will switch to dovato  Will do hiv labs  Contact dermatitis = would do vaseline. Stop using any other product on her lips and watch for improvement  Will be moving to [place] in november

## 2020-12-08 ENCOUNTER — Other Ambulatory Visit: Payer: Self-pay | Admitting: Internal Medicine

## 2020-12-08 DIAGNOSIS — M810 Age-related osteoporosis without current pathological fracture: Secondary | ICD-10-CM

## 2020-12-08 DIAGNOSIS — B20 Human immunodeficiency virus [HIV] disease: Secondary | ICD-10-CM

## 2020-12-08 LAB — T-HELPER CELL (CD4) - (RCID CLINIC ONLY)
CD4 % Helper T Cell: 26 % — ABNORMAL LOW (ref 33–65)
CD4 T Cell Abs: 415 /uL (ref 400–1790)

## 2020-12-10 LAB — COMPLETE METABOLIC PANEL WITH GFR
AG Ratio: 1.4 (calc) (ref 1.0–2.5)
ALT: 15 U/L (ref 6–29)
AST: 23 U/L (ref 10–35)
Albumin: 4.4 g/dL (ref 3.6–5.1)
Alkaline phosphatase (APISO): 51 U/L (ref 37–153)
BUN: 19 mg/dL (ref 7–25)
CO2: 27 mmol/L (ref 20–32)
Calcium: 9.4 mg/dL (ref 8.6–10.4)
Chloride: 106 mmol/L (ref 98–110)
Creat: 0.86 mg/dL (ref 0.50–1.05)
Globulin: 3.1 g/dL (calc) (ref 1.9–3.7)
Glucose, Bld: 72 mg/dL (ref 65–99)
Potassium: 4 mmol/L (ref 3.5–5.3)
Sodium: 141 mmol/L (ref 135–146)
Total Bilirubin: 0.4 mg/dL (ref 0.2–1.2)
Total Protein: 7.5 g/dL (ref 6.1–8.1)
eGFR: 75 mL/min/{1.73_m2} (ref >=60)

## 2020-12-10 LAB — FERRITIN: Ferritin: 27 ng/mL (ref 16–288)

## 2020-12-10 LAB — CBC WITH DIFFERENTIAL/PLATELET
Absolute Monocytes: 369 cells/uL (ref 200–950)
Basophils Absolute: 21 cells/uL (ref 0–200)
Basophils Relative: 0.5 %
Eosinophils Absolute: 78 cells/uL (ref 15–500)
Eosinophils Relative: 1.9 %
HCT: 37.5 % (ref 35.0–45.0)
Hemoglobin: 12.8 g/dL (ref 11.7–15.5)
Lymphs Abs: 1661 cells/uL (ref 850–3900)
MCH: 32.9 pg (ref 27.0–33.0)
MCHC: 34.1 g/dL (ref 32.0–36.0)
MCV: 96.4 fL (ref 80.0–100.0)
MPV: 10.8 fL (ref 7.5–12.5)
Monocytes Relative: 9 %
Neutro Abs: 1972 cells/uL (ref 1500–7800)
Neutrophils Relative %: 48.1 %
Platelets: 292 10*3/uL (ref 140–400)
RBC: 3.89 10*6/uL (ref 3.80–5.10)
RDW: 12.7 % (ref 11.0–15.0)
Total Lymphocyte: 40.5 %
WBC: 4.1 10*3/uL (ref 3.8–10.8)

## 2020-12-10 LAB — IRON, TOTAL/TOTAL IRON BINDING CAP
%SAT: 36 % (calc) (ref 16–45)
Iron: 127 ug/dL (ref 45–160)
TIBC: 355 mcg/dL (calc) (ref 250–450)

## 2020-12-10 LAB — HIV-1 RNA QUANT-NO REFLEX-BLD
HIV 1 RNA Quant: NOT DETECTED Copies/mL
HIV-1 RNA Quant, Log: NOT DETECTED Log cps/mL

## 2020-12-10 LAB — B12 AND FOLATE PANEL
Folate: >24 ng/mL
Vitamin B-12: 576 pg/mL (ref 200–1100)

## 2020-12-11 ENCOUNTER — Other Ambulatory Visit: Payer: Self-pay

## 2020-12-11 MED ORDER — VALACYCLOVIR HCL 1 G PO TABS
1000.0000 mg | ORAL_TABLET | Freq: Three times a day (TID) | ORAL | 3 refills | Status: DC
Start: 2020-12-11 — End: 2021-01-19

## 2020-12-15 ENCOUNTER — Ambulatory Visit
Admission: RE | Admit: 2020-12-15 | Discharge: 2020-12-15 | Disposition: A | Discharge status: Home / Self Care | Payer: Medicaid Other | Source: Ambulatory Visit | Attending: Family Medicine | Admitting: Family Medicine

## 2020-12-15 ENCOUNTER — Other Ambulatory Visit: Payer: Self-pay

## 2020-12-15 DIAGNOSIS — Z1231 Encounter for screening mammogram for malignant neoplasm of breast: Secondary | ICD-10-CM

## 2020-12-22 ENCOUNTER — Other Ambulatory Visit: Payer: Self-pay | Admitting: Internal Medicine

## 2020-12-22 DIAGNOSIS — B20 Human immunodeficiency virus [HIV] disease: Secondary | ICD-10-CM

## 2021-01-19 ENCOUNTER — Other Ambulatory Visit: Payer: Self-pay | Admitting: Internal Medicine

## 2021-01-19 ENCOUNTER — Ambulatory Visit (INDEPENDENT_AMBULATORY_CARE_PROVIDER_SITE_OTHER): Payer: Medicaid Other | Admitting: Internal Medicine

## 2021-01-19 ENCOUNTER — Other Ambulatory Visit: Payer: Self-pay

## 2021-01-19 DIAGNOSIS — B20 Human immunodeficiency virus [HIV] disease: Secondary | ICD-10-CM | POA: Diagnosis not present

## 2021-01-19 DIAGNOSIS — K219 Gastro-esophageal reflux disease without esophagitis: Secondary | ICD-10-CM | POA: Diagnosis present

## 2021-01-19 MED ORDER — OMEPRAZOLE 40 MG PO CPDR: 40.0000 mg | DELAYED_RELEASE_CAPSULE | Freq: Every day | ORAL | 1 refills | Status: AC

## 2021-01-19 MED ORDER — ONDANSETRON HCL 4 MG PO TABS: 4.0000 mg | ORAL_TABLET | Freq: Two times a day (BID) | ORAL | 3 refills | Status: AC

## 2021-01-19 MED ORDER — QUETIAPINE FUMARATE 25 MG PO TABS: ORAL_TABLET | ORAL | 2 refills | Status: AC

## 2021-01-19 MED ORDER — DOVATO 50-300 MG PO TABS: 1.0000 | ORAL_TABLET | Freq: Every day | ORAL | 1 refills | Status: AC

## 2021-01-19 MED ORDER — VALACYCLOVIR HCL 1 G PO TABS: 1000.0000 mg | ORAL_TABLET | Freq: Three times a day (TID) | ORAL | 0 refills | Status: AC

## 2021-01-19 NOTE — Progress Notes (Signed)
Virtual Visit via Telephone Note  I connected with Debra Freeman on 01/19/21 at  3:15 PM EDT by telephone and verified that I am speaking with the correct person using two identifiers.  Location: Patient: at home Provider: at clinic   I discussed the limitations, risks, security and privacy concerns of performing an evaluation and management service by telephone and the availability of in person appointments. I also discussed with the patient that there may be a patient responsible charge related to this service. The patient expressed understanding and agreed to proceed.   History of Present Illness: Recently transitioned to dovato, which she tolerates well. She would like 90 day supply. She is planning to move toward end of October then establishing new providers in [place].   Observations/Objective: Fluent speech  12 point ROS is otherwise negative  Assessment and Plan:  Hiv disease= did 90 d refill for the following medications Dovato and zofran  HSV prophylaxis = continue on Valtrex  Zofran Seroquel  Follow Up Instructions: follow up in 3-6 months if she does not relocate    I discussed the assessment and treatment plan with the patient. The patient was provided an opportunity to ask questions and all were answered. The patient agreed with the plan and demonstrated an understanding of the instructions.   The patient was advised to call back or seek an in-person evaluation if the symptoms worsen or if the condition fails to improve as anticipated.  I provided 10 minutes of non-face-to-face time during this encounter.   Judyann Munson, MD

## 2021-02-10 ENCOUNTER — Telehealth: Payer: Self-pay

## 2021-02-10 NOTE — Telephone Encounter (Signed)
Patient called requesting to re-start Gabapentin for her nerve pain. Reports that she continued to take it and it did provide relief over time. RN unable to fill prescription as it is no longer in chart.   Requesting 90 day supply be sent to [facility] on [address] and to be called once it's been filled.   Sarina Robleto Loyola Mast, RN

## 2021-02-19 ENCOUNTER — Telehealth: Payer: Self-pay

## 2021-02-19 ENCOUNTER — Other Ambulatory Visit: Payer: Self-pay | Admitting: Internal Medicine

## 2021-02-19 MED ORDER — GABAPENTIN 300 MG PO CAPS: 300.0000 mg | ORAL_CAPSULE | Freq: Three times a day (TID) | ORAL | 0 refills | Status: AC

## 2021-02-19 NOTE — Progress Notes (Signed)
Sent in 90 d supply of gabapentin 300mg  TID. NR.

## 2021-02-19 NOTE — Telephone Encounter (Signed)
Received refill request for Pregablin 100 mg. Rx has NEVER been prescribed by Dr Drue Second. Medication is not listed under active medication. Will defer medication to PCP who saw pt  on 10/3. Juanita Laster, RMA

## 2021-03-22 ENCOUNTER — Other Ambulatory Visit: Payer: Self-pay

## 2021-03-22 ENCOUNTER — Ambulatory Visit (INDEPENDENT_AMBULATORY_CARE_PROVIDER_SITE_OTHER): Payer: Medicaid Other | Admitting: Internal Medicine

## 2021-03-22 DIAGNOSIS — B20 Human immunodeficiency virus [HIV] disease: Secondary | ICD-10-CM | POA: Diagnosis not present

## 2021-03-22 NOTE — Progress Notes (Signed)
Virtual Visit via Video Note  I connected with Debra Freeman on 03/22/21 at  9:30 AM EST by a video enabled telemedicine application and verified that I am speaking with the correct person using two identifiers.  Location: Patient: at home Provider: in clinic   I discussed the limitations of evaluation and management by telemedicine and the availability of in person appointments. The patient expressed understanding and agreed to proceed.  History of Present Illness:  In the process of trying to relocate to [facility]. Trying to find a new provider. She states that she is doing well overall but in the process of relocating to [place] is trying to find new primary care doctor. In order to transfer she needs her labs and proof of being positive sent to new provider. She is also needing renewal of Handicap sticker [email or web address]   Observations/Objective: Afebrile, No issues with health  Assessment and Plan: Handicap placard = has difficulty walking with hip/knees. Arthritis. Has had temporary placard renewal.  Follow Up Instructions: re-establishing care in [place].    I discussed the assessment and treatment plan with the patient. The patient was provided an opportunity to ask questions and all were answered. The patient agreed with the plan and demonstrated an understanding of the instructions.   The patient was advised to call back or seek an in-person evaluation if the symptoms worsen or if the condition fails to improve as anticipated.  I provided 10 minutes of non-face-to-face time during this encounter.   Judyann Munson, MD

## 2021-04-13 ENCOUNTER — Telehealth: Payer: Self-pay

## 2021-04-13 NOTE — Telephone Encounter (Signed)
Received prior auth request for Quetiapine Fumarate 25mg  tablets via [place] tracks at [phone].  Ref # [id number]  Approval [id number] 04/13/2021-04/08/2022 Approved for 30 day supply  [facility] contacted and made aware of medication approval.  04/10/2022

## 2021-05-31 ENCOUNTER — Ambulatory Visit: Payer: Medicaid Other | Admitting: Internal Medicine

## 2021-06-16 ENCOUNTER — Ambulatory Visit: Payer: Medicaid Other | Admitting: Dermatology

## 2021-06-29 ENCOUNTER — Telehealth: Payer: Self-pay

## 2021-06-29 NOTE — Telephone Encounter (Signed)
Patient called in today regarding Skin Medicinals cream. She has been using since October but moved to [place]. She was advised to d/c cream as Dr. Nehemiah Massed only wanted her to use for 3 months. Patient instructed to follow up with dermatologist in new area.

## 2021-10-18 IMAGING — MG MM DIGITAL SCREENING BILAT W/ TOMO AND CAD
8 series · 9 of 24 positions shown · non-contrast
Comparison: Previous exam(s).

CLINICAL DATA: Screening.

EXAM:
DIGITAL SCREENING BILATERAL MAMMOGRAM WITH TOMOSYNTHESIS AND CAD
TECHNIQUE: Bilateral screening digital craniocaudal and mediolateral oblique
mammograms were obtained. Bilateral screening digital breast
tomosynthesis was performed. The images were evaluated with
computer-aided detection.

[L CC synth-2D]
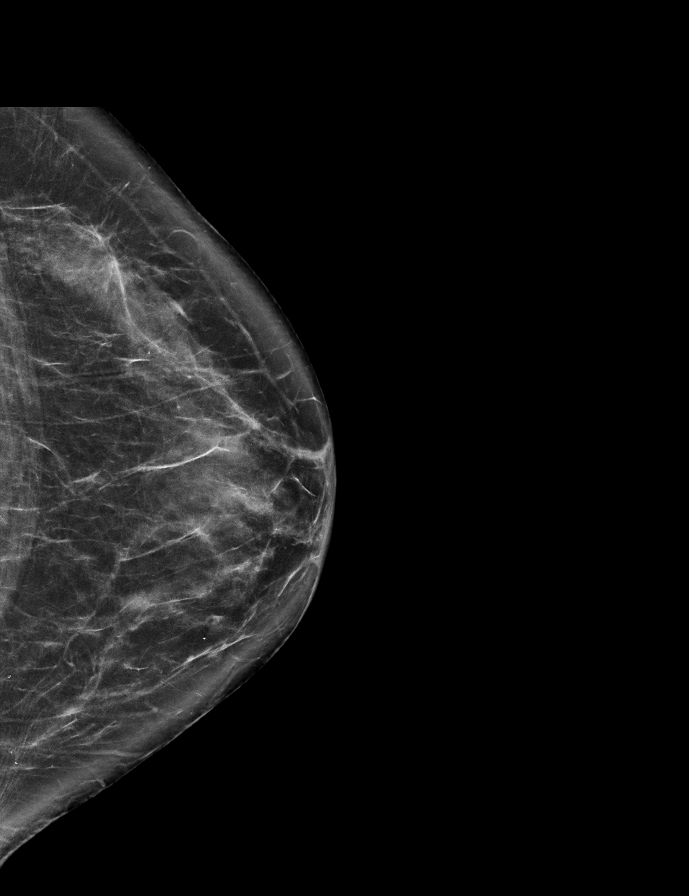

[R MLO synth-2D]
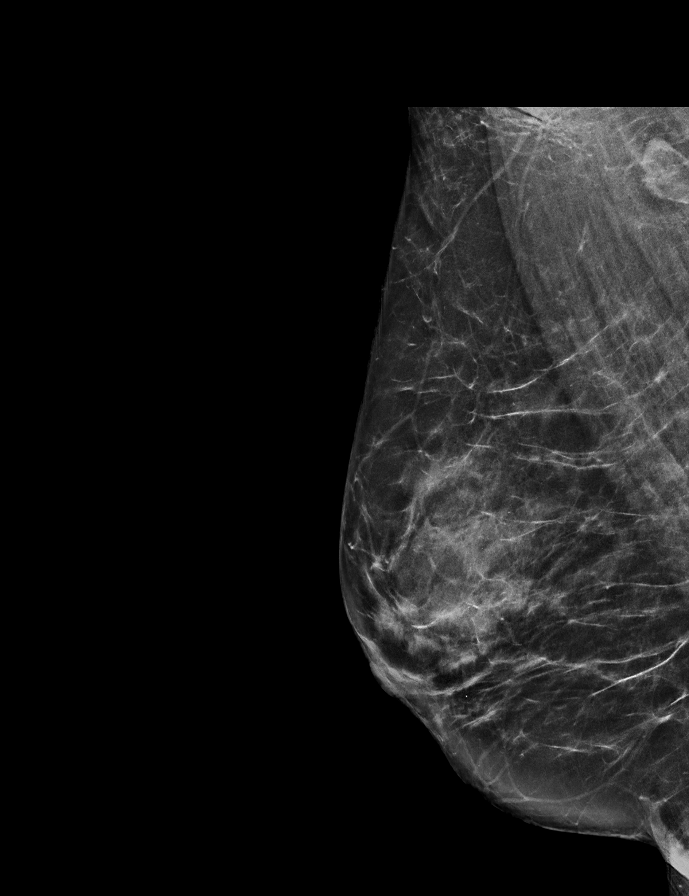

[R CC synth-2D]
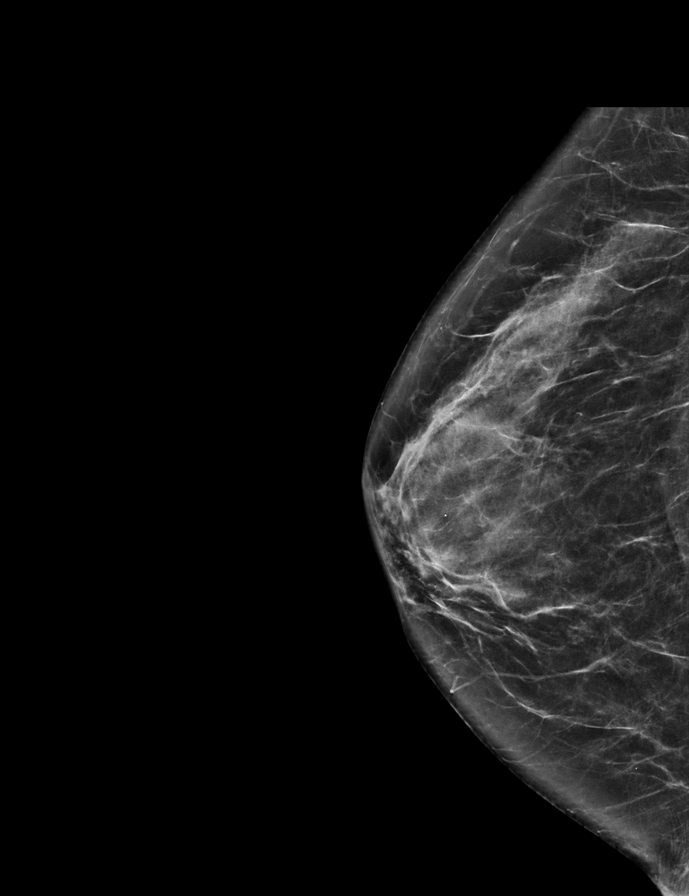

[L MLO synth-2D]
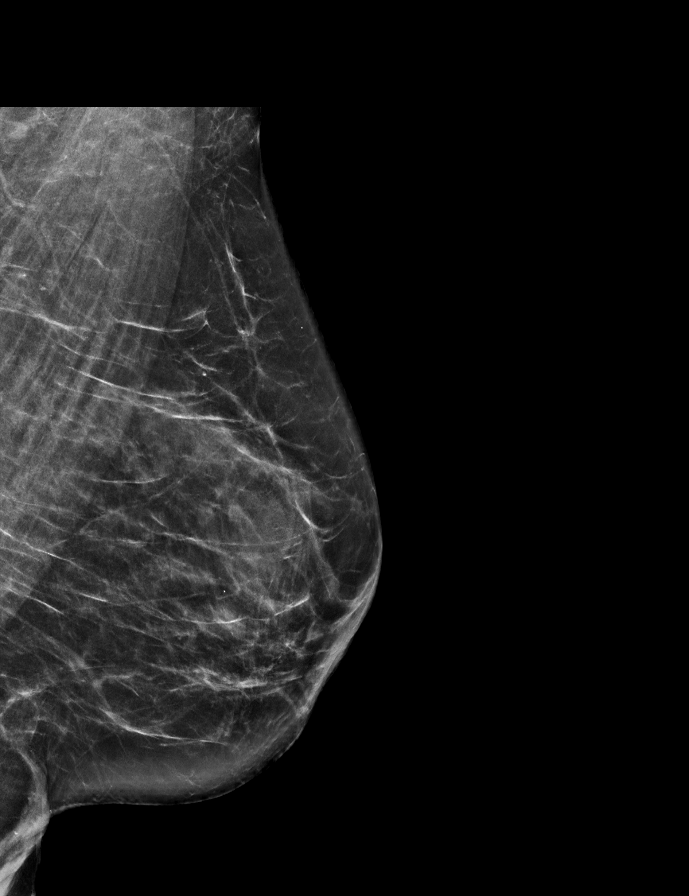

[R CC tomo · 2 of 64 frames shown]
[frame 21/64]
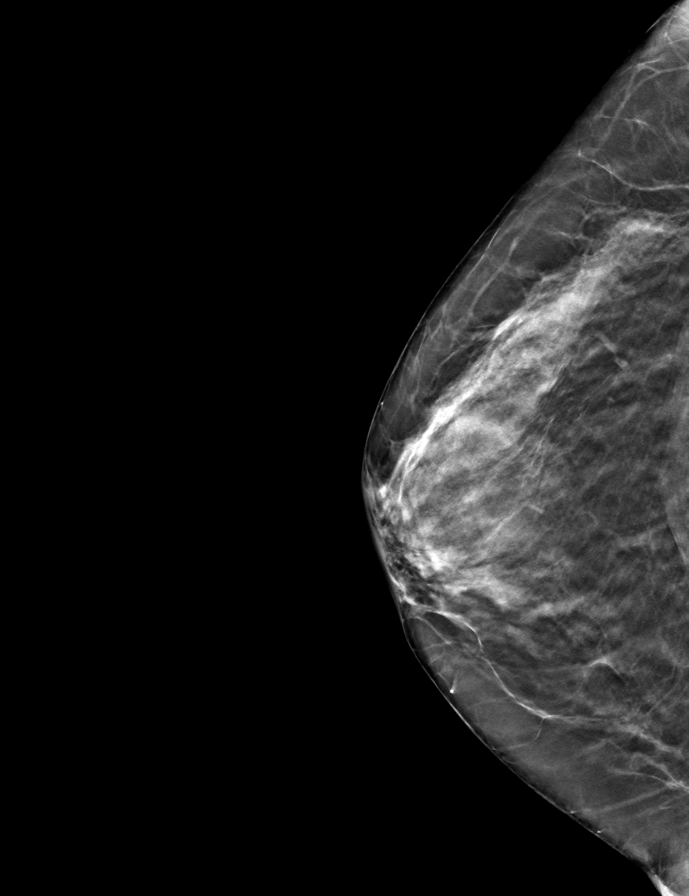
[frame 33/64]
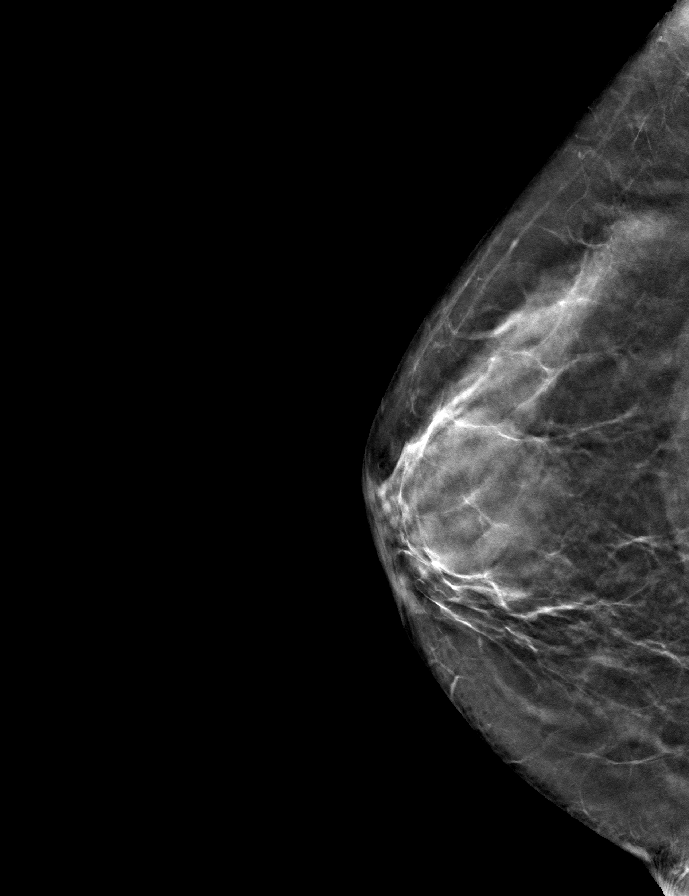

[L MLO tomo · tomo slice 33/66.0]
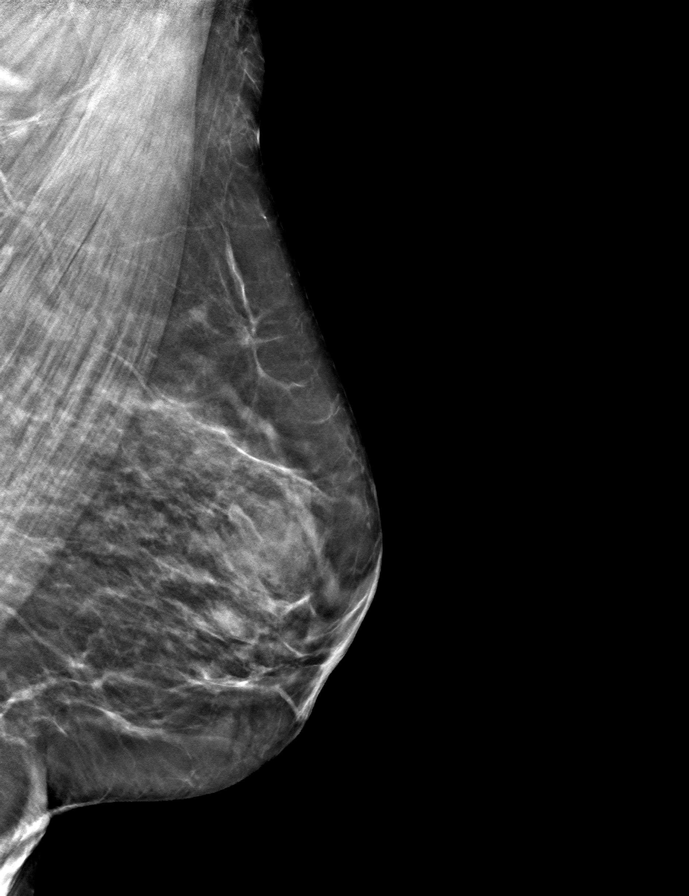

[R MLO tomo · tomo slice 33/65.0]
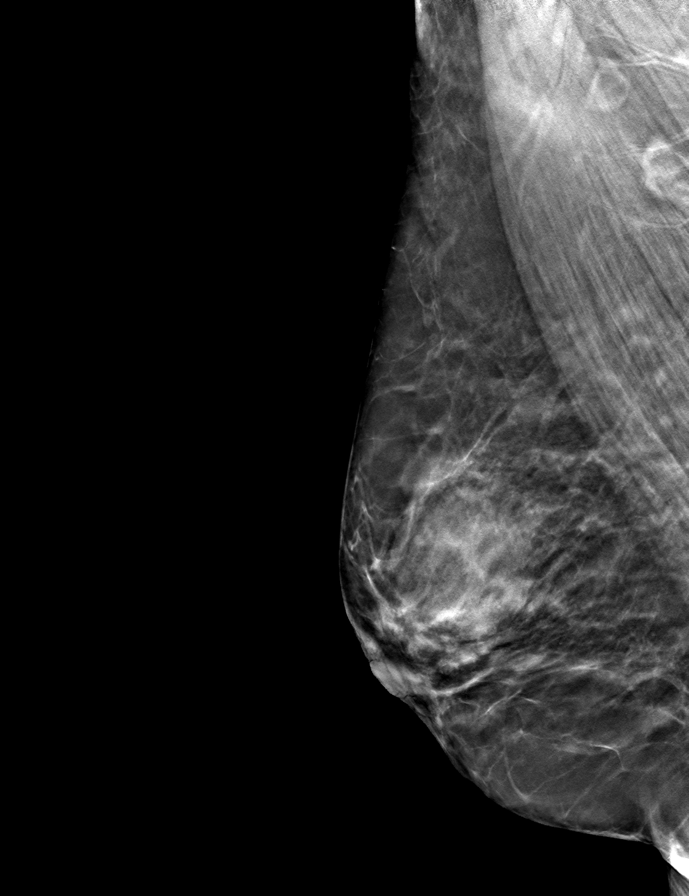

[L CC tomo · tomo slice 35/69.0]
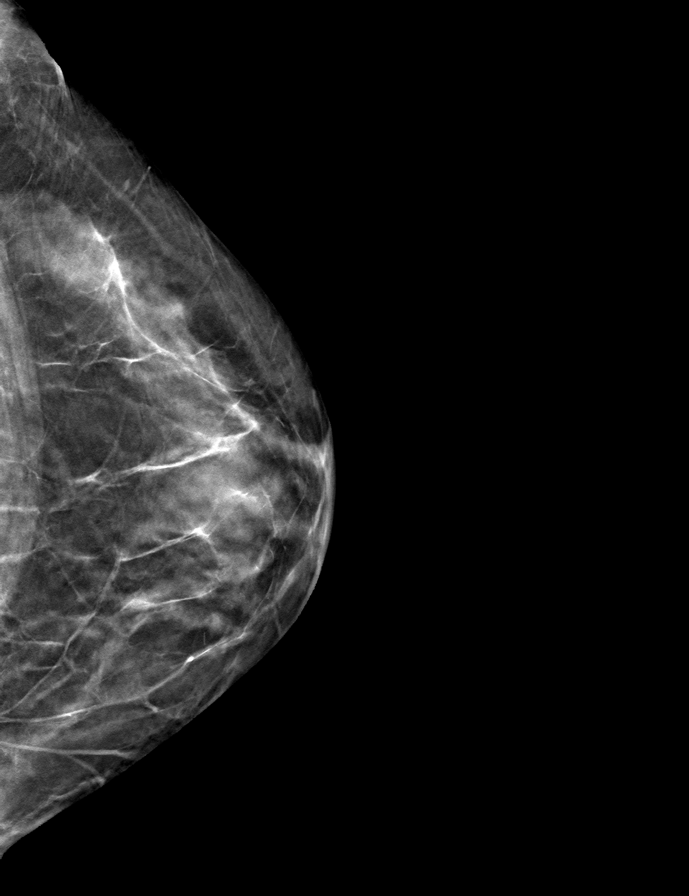

[9 of 24 positions shown; findings below may reference images not displayed]

ACR Breast Density Category c: The breast tissue is heterogeneously
dense, which may obscure small masses.
FINDINGS: There are no findings suspicious for malignancy.
IMPRESSION: No mammographic evidence of malignancy. A result letter of this
screening mammogram will be mailed directly to the patient.

RECOMMENDATION:
Screening mammogram in one year. (Code:Q3-W-BC3)

BI-RADS CATEGORY  1: Negative.
